# Patient Record
Sex: Male | Born: 1985
Health system: Southern US, Community
[De-identification: ages and names within clinical notes are randomized; demographics above are authoritative.]

## PROBLEM LIST (undated history)

## (undated) DIAGNOSIS — N200 Calculus of kidney: Secondary | ICD-10-CM

## (undated) HISTORY — PX: LACERATION REPAIR: SHX5168

---

## 2015-03-04 ENCOUNTER — Encounter (HOSPITAL_COMMUNITY): Payer: Self-pay | Admitting: *Deleted

## 2015-03-04 ENCOUNTER — Emergency Department (HOSPITAL_COMMUNITY)
Admission: EM | Admit: 2015-03-04 | Discharge: 2015-03-04 | Disposition: A | Discharge status: Home / Self Care | Payer: Self-pay | Attending: Emergency Medicine | Admitting: Emergency Medicine

## 2015-03-04 DIAGNOSIS — N200 Calculus of kidney: Secondary | ICD-10-CM | POA: Insufficient documentation

## 2015-03-04 HISTORY — DX: Calculus of kidney: N20.0

## 2015-03-04 LAB — CBC WITH DIFFERENTIAL/PLATELET
Basophils Absolute: 0 10*3/uL (ref 0.0–0.1)
Basophils Relative: 0 % (ref 0–1)
Eosinophils Absolute: 0.2 10*3/uL (ref 0.0–0.7)
Eosinophils Relative: 2 % (ref 0–5)
HCT: 44.3 % (ref 39.0–52.0)
HEMOGLOBIN: 15.1 g/dL (ref 13.0–17.0)
LYMPHS PCT: 37 % (ref 12–46)
Lymphs Abs: 3 10*3/uL (ref 0.7–4.0)
MCH: 28.7 pg (ref 26.0–34.0)
MCHC: 34.1 g/dL (ref 30.0–36.0)
MCV: 84.1 fL (ref 78.0–100.0)
Monocytes Absolute: 0.4 10*3/uL (ref 0.1–1.0)
Monocytes Relative: 5 % (ref 3–12)
NEUTROS ABS: 4.5 10*3/uL (ref 1.7–7.7)
NEUTROS PCT: 56 % (ref 43–77)
Platelets: 168 10*3/uL (ref 150–400)
RBC: 5.27 MIL/uL (ref 4.22–5.81)
RDW: 13.1 % (ref 11.5–15.5)
WBC: 8 10*3/uL (ref 4.0–10.5)

## 2015-03-04 LAB — COMPREHENSIVE METABOLIC PANEL
ALBUMIN: 4.1 g/dL (ref 3.5–5.2)
ALT: 36 U/L (ref 0–53)
AST: 24 U/L (ref 0–37)
Alkaline Phosphatase: 69 U/L (ref 39–117)
Anion gap: 10 (ref 5–15)
BILIRUBIN TOTAL: 1.2 mg/dL (ref 0.3–1.2)
BUN: 10 mg/dL (ref 6–23)
CALCIUM: 9.4 mg/dL (ref 8.4–10.5)
CO2: 27 mmol/L (ref 19–32)
CREATININE: 1.32 mg/dL (ref 0.50–1.35)
Chloride: 103 mmol/L (ref 96–112)
GFR calc Af Amer: 83 mL/min — ABNORMAL LOW (ref >90)
GFR, EST NON AFRICAN AMERICAN: 72 mL/min — AB (ref >90)
Glucose, Bld: 110 mg/dL — ABNORMAL HIGH (ref 70–99)
Potassium: 3.9 mmol/L (ref 3.5–5.1)
Sodium: 140 mmol/L (ref 135–145)
Total Protein: 6.6 g/dL (ref 6.0–8.3)

## 2015-03-04 LAB — URINALYSIS, ROUTINE W REFLEX MICROSCOPIC
Bilirubin Urine: NEGATIVE
GLUCOSE, UA: NEGATIVE mg/dL
KETONES UR: NEGATIVE mg/dL
LEUKOCYTES UA: NEGATIVE
NITRITE: NEGATIVE
Protein, ur: 30 mg/dL — AB
Specific Gravity, Urine: 1.028 (ref 1.005–1.030)
Urobilinogen, UA: 0.2 mg/dL (ref 0.0–1.0)
pH: 5 (ref 5.0–8.0)

## 2015-03-04 LAB — URINE MICROSCOPIC-ADD ON

## 2015-03-04 MED ORDER — ONDANSETRON HCL 4 MG PO TABS
8.0000 mg | ORAL_TABLET | Freq: Once | ORAL | Status: AC
  Administered 2015-03-04: 8 mg via ORAL

## 2015-03-04 MED ORDER — HYDROMORPHONE HCL 1 MG/ML IJ SOLN
INTRAMUSCULAR | Status: AC
  Filled 2015-03-04: qty 1

## 2015-03-04 MED ORDER — OXYCODONE-ACETAMINOPHEN 5-325 MG PO TABS: 1.0000 | ORAL_TABLET | Freq: Four times a day (QID) | ORAL | Status: DC | PRN

## 2015-03-04 MED ORDER — ONDANSETRON 4 MG PO TBDP
ORAL_TABLET | ORAL | Status: AC
  Administered 2015-03-04: 8 mg
  Filled 2015-03-04: qty 2

## 2015-03-04 MED ORDER — SODIUM CHLORIDE 0.9 % IV BOLUS (SEPSIS)
1000.0000 mL | INTRAVENOUS | Status: AC
  Administered 2015-03-04: 1000 mL via INTRAVENOUS

## 2015-03-04 MED ORDER — HYDROMORPHONE HCL 1 MG/ML IJ SOLN
1.0000 mg | Freq: Once | INTRAMUSCULAR | Status: AC
  Administered 2015-03-04: 1 mg via INTRAVENOUS

## 2015-03-04 MED ORDER — OXYCODONE-ACETAMINOPHEN 5-325 MG PO TABS
1.0000 | ORAL_TABLET | Freq: Once | ORAL | Status: AC
Start: 2015-03-04 — End: 2015-03-04
  Administered 2015-03-04: 1 via ORAL
  Filled 2015-03-04: qty 1

## 2015-03-04 NOTE — ED Provider Notes (Signed)
CSN: [id number]     Arrival date & time 03/04/15  0100 History   First MD Initiated Contact with Patient 03/04/15 0155     This chart was scribed for Purvis Sheffield, MD by Arlan Organ, ED Scribe. This patient was seen in room A13C/A13C and the patient's care was started 1:59 AM.   Chief Complaint  Patient presents with  . Flank Pain   Patient is a 29 y.o. male presenting with flank pain. The history is provided by the patient. No language interpreter was used.  Flank Pain This is a recurrent problem. The current episode started 3 to 5 hours ago. The problem occurs constantly. The problem has not changed since onset.Pertinent negatives include no chest pain, no abdominal pain, no headaches and no shortness of breath. Nothing aggravates the symptoms. Nothing relieves the symptoms. He has tried nothing for the symptoms.    HPI Comments: Thelmer Legler is a 29 y.o. male without any pertinent past medical history who presents to the Emergency Department complaining of constant, sudden onset, non-radiating R sided flank pain x 4 hours. Pain currently rated 8/10. He also reports 4 episodes of vomiting this evening. Pt has tried OTC Ibuprofen without any improvement for symptoms. Pt reports previous history of 3 kidney stones all passed naturally. No recent fever or chills. Pt with known allergies to Vicodin and Hydrocodone.  Past Medical History  Diagnosis Date  . Kidney stones    History reviewed. No pertinent past surgical history. No family history on file. History  Substance Use Topics  . Smoking status: Never Smoker   . Smokeless tobacco: Never Used  . Alcohol Use: No    Review of Systems  Constitutional: Negative for fever and chills.  HENT: Negative for rhinorrhea and sore throat.   Eyes: Negative for visual disturbance.  Respiratory: Negative for cough and shortness of breath.   Cardiovascular: Negative for chest pain and leg swelling.  Gastrointestinal: Positive for nausea and  vomiting. Negative for abdominal pain and diarrhea.  Genitourinary: Positive for flank pain. Negative for dysuria.  Musculoskeletal: Negative for back pain and neck pain.  Skin: Negative for rash.  Neurological: Negative for dizziness, light-headedness and headaches.  Hematological: Does not bruise/bleed easily.  Psychiatric/Behavioral: Negative for confusion.      Allergies  Hydrocodone  Home Medications   Prior to Admission medications   Not on File   Triage Vitals: BP 145/91 mmHg  Pulse 73  Temp(Src) 97.7 F (36.5 C) (Oral)  Resp 18  Ht  (1.905 m)  Wt 246 lb (111.585 kg)  BMI 30.75 kg/m2  SpO2 99%   Physical Exam  Constitutional: He is oriented to person, place, and time. He appears well-developed and well-nourished. No distress.  HENT:  Head: Normocephalic and atraumatic.  Right Ear: Hearing normal.  Left Ear: Hearing normal.  Nose: Nose normal.  Mouth/Throat: Oropharynx is clear and moist and mucous membranes are normal.  Eyes: Conjunctivae and EOM are normal. Pupils are equal, round, and reactive to light.  Neck: Normal range of motion. Neck supple.  Cardiovascular: Regular rhythm, S1 normal and S2 normal.  Exam reveals no gallop and no friction rub.   No murmur heard. Pulmonary/Chest: Effort normal and breath sounds normal. No respiratory distress. He exhibits no tenderness.  Abdominal: Soft. Normal appearance and bowel sounds are normal. There is no hepatosplenomegaly. There is no tenderness. There is no rebound, no guarding, no tenderness at McBurney's point and negative Murphy's sign. No hernia.  No focal tenderness  Musculoskeletal: Normal range of motion.  Neurological: He is alert and oriented to person, place, and time. He has normal strength. No cranial nerve deficit or sensory deficit. Coordination normal. GCS eye subscore is 4. GCS verbal subscore is 5. GCS motor subscore is 6.  Skin: Skin is warm, dry and intact. No rash noted. No cyanosis.   Psychiatric: He has a normal mood and affect. His speech is normal and behavior is normal. Thought content normal.  Nursing note and vitals reviewed.   ED Course  Procedures (including critical care time)  DIAGNOSTIC STUDIES: Oxygen Saturation is 99% on RA, Normal by my interpretation.    COORDINATION OF CARE: 2:11 AM-Discussed treatment plan with pt at bedside and pt agreed to plan.     Labs Review Labs Reviewed  URINALYSIS, ROUTINE W REFLEX MICROSCOPIC - Abnormal; Notable for the following:    APPearance TURBID (*)    Hgb urine dipstick LARGE (*)    Protein, ur 30 (*)    All other components within normal limits  URINE MICROSCOPIC-ADD ON - Abnormal; Notable for the following:    Bacteria, UA MANY (*)    Crystals CA OXALATE CRYSTALS (*)    All other components within normal limits  URINE CULTURE  CBC WITH DIFFERENTIAL/PLATELET  COMPREHENSIVE METABOLIC PANEL    Imaging Review No results found.   EKG Interpretation None      MDM   Final diagnoses:  Kidney stone    6:47 AM 29 y.o. male here with right-sided flank pain consistent with previous kidney stones. No focal tenderness of his abdomen on exam. Afebrile and vital signs are unremarkable here. Do not think any imaging is needed as his symptoms are consistent with previous stones. He does have some bacteria in his urine, we'll send urine culture. Will provide resources to follow-up with urology. Return for any worsening including vomiting, worsening pain, or fever.   I personally performed the services described in this documentation, which was scribed in my presence. The recorded information has been reviewed and is accurate.    Purvis SheffieldForrest Hilaria Titsworth, MD 03/04/15 [phone]

## 2015-03-04 NOTE — ED Notes (Signed)
Pt c/o R flank pain onset tonight associated with NV. Hx of kidney stones; sts pain feels similar

## 2015-03-04 NOTE — Discharge Instructions (Signed)

## 2015-03-04 NOTE — ED Notes (Signed)
C/o rt flank pain with vomiting. Pt has hx of kidney stones and states it feels similar. Vomited x3 times.

## 2015-03-05 LAB — URINE CULTURE
Colony Count: NO GROWTH
Culture: NO GROWTH

## 2020-10-28 ENCOUNTER — Other Ambulatory Visit: Payer: Self-pay

## 2020-10-28 ENCOUNTER — Emergency Department (HOSPITAL_BASED_OUTPATIENT_CLINIC_OR_DEPARTMENT_OTHER): Payer: Self-pay

## 2020-10-28 ENCOUNTER — Encounter (HOSPITAL_BASED_OUTPATIENT_CLINIC_OR_DEPARTMENT_OTHER): Payer: Self-pay | Admitting: *Deleted

## 2020-10-28 ENCOUNTER — Emergency Department (HOSPITAL_BASED_OUTPATIENT_CLINIC_OR_DEPARTMENT_OTHER)
Admission: EM | Admit: 2020-10-28 | Discharge: 2020-10-28 | Disposition: A | Discharge status: Home / Self Care | Payer: Self-pay | Attending: Emergency Medicine | Admitting: Emergency Medicine

## 2020-10-28 DIAGNOSIS — Z87442 Personal history of urinary calculi: Secondary | ICD-10-CM | POA: Insufficient documentation

## 2020-10-28 DIAGNOSIS — N132 Hydronephrosis with renal and ureteral calculous obstruction: Secondary | ICD-10-CM | POA: Insufficient documentation

## 2020-10-28 LAB — BASIC METABOLIC PANEL
Anion gap: 10 (ref 5–15)
BUN: 18 mg/dL (ref 6–20)
CO2: 24 mmol/L (ref 22–32)
Calcium: 9.4 mg/dL (ref 8.9–10.3)
Chloride: 102 mmol/L (ref 98–111)
Creatinine, Ser: 1.57 mg/dL — ABNORMAL HIGH (ref 0.61–1.24)
GFR, Estimated: 59 mL/min — ABNORMAL LOW (ref >60)
Glucose, Bld: 131 mg/dL — ABNORMAL HIGH (ref 70–99)
Potassium: 4.2 mmol/L (ref 3.5–5.1)
Sodium: 136 mmol/L (ref 135–145)

## 2020-10-28 LAB — CBC WITH DIFFERENTIAL/PLATELET
Abs Immature Granulocytes: 0.05 10*3/uL (ref 0.00–0.07)
Basophils Absolute: 0.1 10*3/uL (ref 0.0–0.1)
Basophils Relative: 0 %
Eosinophils Absolute: 0 10*3/uL (ref 0.0–0.5)
Eosinophils Relative: 0 %
HCT: 49.7 % (ref 39.0–52.0)
Hemoglobin: 16.8 g/dL (ref 13.0–17.0)
Immature Granulocytes: 0 %
Lymphocytes Relative: 11 %
Lymphs Abs: 1.6 10*3/uL (ref 0.7–4.0)
MCH: 28.9 pg (ref 26.0–34.0)
MCHC: 33.8 g/dL (ref 30.0–36.0)
MCV: 85.5 fL (ref 80.0–100.0)
Monocytes Absolute: 0.9 10*3/uL (ref 0.1–1.0)
Monocytes Relative: 6 %
Neutro Abs: 11.8 10*3/uL — ABNORMAL HIGH (ref 1.7–7.7)
Neutrophils Relative %: 83 %
Platelets: 224 10*3/uL (ref 150–400)
RBC: 5.81 MIL/uL (ref 4.22–5.81)
RDW: 12.6 % (ref 11.5–15.5)
WBC: 14.3 10*3/uL — ABNORMAL HIGH (ref 4.0–10.5)
nRBC: 0 % (ref 0.0–0.2)

## 2020-10-28 LAB — URINALYSIS, ROUTINE W REFLEX MICROSCOPIC
Bilirubin Urine: NEGATIVE
Glucose, UA: NEGATIVE mg/dL
Ketones, ur: NEGATIVE mg/dL
Leukocytes,Ua: NEGATIVE
Nitrite: NEGATIVE
Protein, ur: 30 mg/dL — AB
Specific Gravity, Urine: >1.03 — ABNORMAL HIGH (ref 1.005–1.030)
pH: 6.5 (ref 5.0–8.0)

## 2020-10-28 LAB — URINALYSIS, MICROSCOPIC (REFLEX)

## 2020-10-28 LAB — LIPASE, BLOOD: Lipase: 39 U/L (ref 11–51)

## 2020-10-28 MED ORDER — ONDANSETRON 4 MG PO TBDP: 4.0000 mg | ORAL_TABLET | Freq: Three times a day (TID) | ORAL | 0 refills | Status: DC | PRN

## 2020-10-28 MED ORDER — HYDROMORPHONE HCL 1 MG/ML IJ SOLN
0.5000 mg | Freq: Once | INTRAMUSCULAR | Status: AC
  Administered 2020-10-28: 0.5 mg via INTRAVENOUS
  Filled 2020-10-28: qty 1

## 2020-10-28 MED ORDER — SODIUM CHLORIDE 0.9 % IV BOLUS
1000.0000 mL | Freq: Once | INTRAVENOUS | Status: AC
  Administered 2020-10-28: 1000 mL via INTRAVENOUS

## 2020-10-28 MED ORDER — KETOROLAC TROMETHAMINE 30 MG/ML IJ SOLN
30.0000 mg | Freq: Once | INTRAMUSCULAR | Status: AC
  Administered 2020-10-28: 30 mg via INTRAVENOUS
  Filled 2020-10-28: qty 1

## 2020-10-28 MED ORDER — OXYCODONE-ACETAMINOPHEN 5-325 MG PO TABS: 1.0000 | ORAL_TABLET | Freq: Four times a day (QID) | ORAL | 0 refills | Status: DC | PRN

## 2020-10-28 MED ORDER — TAMSULOSIN HCL 0.4 MG PO CAPS: 0.4000 mg | ORAL_CAPSULE | Freq: Every day | ORAL | 0 refills | Status: AC

## 2020-10-28 MED ORDER — ONDANSETRON HCL 4 MG/2ML IJ SOLN
4.0000 mg | Freq: Once | INTRAMUSCULAR | Status: AC
  Administered 2020-10-28: 4 mg via INTRAVENOUS
  Filled 2020-10-28: qty 2

## 2020-10-28 MED ORDER — HYDROMORPHONE HCL 1 MG/ML IJ SOLN
1.0000 mg | Freq: Once | INTRAMUSCULAR | Status: AC
  Administered 2020-10-28: 1 mg via INTRAVENOUS
  Filled 2020-10-28: qty 1

## 2020-10-28 MED ORDER — IOHEXOL 300 MG/ML  SOLN
100.0000 mL | Freq: Once | INTRAMUSCULAR | Status: AC | PRN
  Administered 2020-10-28: 100 mL via INTRAVENOUS

## 2020-10-28 NOTE — ED Provider Notes (Signed)
[facility] EMERGENCY DEPARTMENT Provider Note   CSN: [id number] Arrival date & time: 10/28/20  1106     History Chief Complaint  Patient presents with  . Abdominal Pain  . Emesis    Austin Heath is a 34 y.o. male who presents to the ED today with complaint of sudden onset, constant, sharp, left flank pain radiating into LLQ that woke pt out of his sleep at 1 AM this morning. Pt also complains of nausea and NBNB emesis x 3. He reports history of kidney stones in the past however states this feels different. Last kidney stone was in 2016. He has had to have surgery twice for removal of stones in the past. Pt does mention he has not urinated all morning which is atypical for him. Last normal bowel movement last night. Denies any recent sick contacts or suspicious food intake. Wife is at bedside without any symptoms. Pt denies fevers, chills, dysuria, hematuria, diarrhea, constipation, testicular pain, or any other associated symptoms.   The history is provided by the patient and medical records.       Past Medical History:  Diagnosis Date  . Kidney stones     There are no problems to display for this patient.   Past Surgical History:  Procedure Laterality Date  . LACERATION REPAIR         No family history on file.  Social History   Tobacco Use  . Smoking status: Never Smoker  . Smokeless tobacco: Never Used  Substance Use Topics  . Alcohol use: No  . Drug use: No    Home Medications Prior to Admission medications   Medication Sig Start Date End Date Taking? Authorizing Provider  ondansetron (ZOFRAN ODT) 4 MG disintegrating tablet Take 1 tablet (4 mg total) by mouth every 8 (eight) hours as needed for nausea or vomiting. 10/28/20   Tanda RockersVenter, Jahson Emanuele, PA-C  oxyCODONE-acetaminophen (PERCOCET/ROXICET) 5-325 MG tablet Take 1 tablet by mouth every 6 (six) hours as needed for severe pain. 10/28/20   Tanda RockersVenter, Jene Huq, PA-C  tamsulosin (FLOMAX) 0.4 MG CAPS  capsule Take 1 capsule (0.4 mg total) by mouth daily for 7 days. 10/28/20 11/04/20  Tanda RockersVenter, Fidelis Loth, PA-C    Allergies    Hydrocodone  Review of Systems   Review of Systems  Constitutional: Negative for chills and fever.  Gastrointestinal: Positive for abdominal pain, nausea and vomiting. Negative for constipation and diarrhea.  Genitourinary: Positive for difficulty urinating and flank pain. Negative for dysuria, enuresis, frequency, scrotal swelling and testicular pain.  All other systems reviewed and are negative.   Physical Exam Updated Vital Signs BP (!) 143/89   Pulse 84   Temp 98.1 F (36.7 C) (Oral)   Resp 18   Ht 6\' 3"  (1.905 m)   Wt 116.7 kg   SpO2 100%   BMI 32.16 kg/m   Physical Exam Vitals and nursing note reviewed.  Constitutional:      Appearance: He is not ill-appearing or diaphoretic.  HENT:     Head: Normocephalic and atraumatic.  Eyes:     Conjunctiva/sclera: Conjunctivae normal.  Cardiovascular:     Rate and Rhythm: Normal rate and regular rhythm.     Heart sounds: Normal heart sounds.  Pulmonary:     Effort: Pulmonary effort is normal.     Breath sounds: Normal breath sounds. No wheezing, rhonchi or rales.  Abdominal:     Palpations: Abdomen is soft.     Tenderness: There is generalized abdominal tenderness and tenderness  in the left lower quadrant. There is left CVA tenderness. There is no guarding or rebound.     Comments: Soft, diffuse abdominal TTP however worse in the LLQ, +BS throughout, no r/g/r, neg murphy's, neg mcburney's, + left CVA TTP  Musculoskeletal:     Cervical back: Neck supple.  Skin:    General: Skin is warm and dry.  Neurological:     Mental Status: He is alert.     ED Results / Procedures / Treatments   Labs (all labs ordered are listed, but only abnormal results are displayed) Labs Reviewed  URINALYSIS, ROUTINE W REFLEX MICROSCOPIC - Abnormal; Notable for the following components:      Result Value   Specific  Gravity, Urine >1.030 (*)    Hgb urine dipstick MODERATE (*)    Protein, ur 30 (*)    All other components within normal limits  URINALYSIS, MICROSCOPIC (REFLEX) - Abnormal; Notable for the following components:   Bacteria, UA FEW (*)    All other components within normal limits  BASIC METABOLIC PANEL - Abnormal; Notable for the following components:   Glucose, Bld 131 (*)    Creatinine, Ser 1.57 (*)    GFR, Estimated 59 (*)    All other components within normal limits  CBC WITH DIFFERENTIAL/PLATELET - Abnormal; Notable for the following components:   WBC 14.3 (*)    Neutro Abs 11.8 (*)    All other components within normal limits  LIPASE, BLOOD    EKG None  Radiology CT Abdomen Pelvis W Contrast  Result Date: 10/28/2020 CLINICAL DATA:  Left-sided flank and back pain with nausea and vomiting. EXAM: CT ABDOMEN AND PELVIS WITH CONTRAST TECHNIQUE: Multidetector CT imaging of the abdomen and pelvis was performed using the standard protocol following bolus administration of intravenous contrast. CONTRAST:  OMNIPAQUE IOHEXOL 300 MG/ML  SOLN COMPARISON:  10/15/2015 FINDINGS: Lower chest: The lung bases are clear of acute process. No pleural effusion or pulmonary lesions. The heart is normal in size. No pericardial effusion. The distal esophagus and aorta are unremarkable. Hepatobiliary: Diffuse fatty infiltration of the liver but no focal hepatic lesions or intrahepatic biliary dilatation. The gallbladder is normal. No common bile duct dilatation. The portal and hepatic veins are patent. Pancreas: No mass, inflammation or ductal dilatation. Spleen: Normal size.  No focal lesions. Adrenals/Urinary Tract: The adrenal glands are normal. The right kidney is normal.  The bladder is normal. Decreased perfusion of the right kidney and moderate hydroureteronephrosis down to an obstructing 6 x 5 mm calculus in the upper left ureter located at the L3 vertebral body level. On the coronal images this  may actually be 2 adjacent smaller stones. No more distal ureteral calculi. No bladder calculi. Stomach/Bowel: The stomach, duodenum, small bowel and colon are grossly normal without oral contrast. No inflammatory changes, mass lesions or obstructive findings. The appendix is normal. Vascular/Lymphatic: The aorta is normal in caliber. No dissection. The branch vessels are patent. The major venous structures are patent. No mesenteric or retroperitoneal mass or adenopathy. Small scattered lymph nodes are noted. Reproductive: The prostate gland and seminal vesicles are unremarkable. Bilateral vasectomy clips are noted. Other: No pelvic mass or adenopathy. No free pelvic fluid collections. No inguinal mass or adenopathy. No abdominal wall hernia or subcutaneous lesions. Musculoskeletal: No significant bony findings. IMPRESSION: 1. 6 x 5 mm upper left ureteral calculus causing moderate to high-grade obstruction. I believe this might actually be 2 adjacent calculi with the upper calculus measuring a maximum  of 4 mm and the lower calculus measuring a maximum of 3 mm. 2. No other significant abdominal/pelvic findings, mass lesions or adenopathy. 3. Diffuse fatty infiltration of the liver. Electronically Signed   By: Rudie Meyer M.D.   On: 10/28/2020 12:47    Procedures Procedures (including critical care time)  Medications Ordered in ED Medications  sodium chloride 0.9 % bolus 1,000 mL (0 mLs Intravenous Stopped 10/28/20 1320)  ondansetron (ZOFRAN) injection 4 mg (4 mg Intravenous Given 10/28/20 1158)  ketorolac (TORADOL) 30 MG/ML injection 30 mg (30 mg Intravenous Given 10/28/20 1158)  iohexol (OMNIPAQUE) 300 MG/ML solution 100 mL (100 mLs Intravenous Contrast Given 10/28/20 1215)  HYDROmorphone (DILAUDID) injection 1 mg (1 mg Intravenous Given 10/28/20 1258)  HYDROmorphone (DILAUDID) injection 0.5 mg (0.5 mg Intravenous Given 10/28/20 1451)    ED Course  I have reviewed the triage vital signs and the  nursing notes.  Pertinent labs & imaging results that were available during my care of the patient were reviewed by me and considered in my medical decision making (see chart for details).  Clinical Course as of Oct 28 1525  Thu Oct 28, 2020  1324 Discussed case with Dr. Ronne Binning with urology who recommends outpatient follow up as long as pain is controlled.    [MV]    Clinical Course User Index [MV] Tanda Rockers, PA-C   MDM Rules/Calculators/A&P                          34 year old male presenting to the ED today with complaint of sudden onset, constant, left flank pain that began around 1 AM with associated N/V. History of stones however states this feels different. On arrival VSS. Pt is afebrile, nontachycardic, and nontachypneic. On exam pt has diffuse abdominal TTP however worse in the left flank/LLQ. Suspect diffuse pain s/2 multiple episodes of emesis. Will plan for U/A at this time and if any signs of hgb will proceed with concern for kidney stones given history. I have lower suspicion for acute abdomen at this time. Pain meds provided.   U/A with moderate hgb and 11-20 RBCs. Per HPF. CBC and BMP ordered with CT renal stone study.   CBC has returned with a leukocytosis of 14.3. In the setting of diffuse pain and leukocytosis have changed CT scan to one with contrast to assess for other etiologies as well at stones. Lipase added.   BMP with creatinine 1.57. Pt receiving fluids.   Lab Results  Component Value Date   CREATININE 1.57 (H) 10/28/2020   CREATININE 1.32 03/04/2015   Lipase 39  CT scan with findings of 6x5 mm stone with moderate to high grade obstruction. Pt did report some issues with urinating however was able to give [place] a urine sample without difficulty and a post void residual was 0 CCs. Given large stone and high grade obstruction will consult urology at this time  Discussed case with urology - see above in ED course. Will treat pain and reevaluate.   Pt  reevaluated after total 1.5 mg dilaudid and toradol. Reports pain is improved. Has been able to tolerate gingerale without difficulty. Will discharge home at this time with urology follow up with zofran, pain meds, and flomax. Pt is in agreement with plan and stable for discharge home.   This note was prepared using Dragon voice recognition software and may include unintentional dictation errors due to the inherent limitations of voice recognition software.  Final  Clinical Impression(s) / ED Diagnoses Final diagnoses:  Hydronephrosis with renal and ureteral calculus obstruction    Rx / DC Orders ED Discharge Orders         Ordered    tamsulosin (FLOMAX) 0.4 MG CAPS capsule  Daily        10/28/20 1523    ondansetron (ZOFRAN ODT) 4 MG disintegrating tablet  Every 8 hours PRN        10/28/20 1523    oxyCODONE-acetaminophen (PERCOCET/ROXICET) 5-325 MG tablet  Every 6 hours PRN        10/28/20 1523           Discharge Instructions     Please follow up with [facility] regarding ED visit today I have prescribed medication for you to take including flomax to help you urinate and push out the stone. Increase your fluid intake to help as well.  Take pain medication and nausea medication as needed  Return to the ED IMMEDIATELY for any worsening symptoms including worsening pain, fevers > 100.4, inability to urinate, or any other new/concerning symptoms       Tanda Rockers, PA-C 10/28/20 1526    Tegeler, Canary Brim, MD 10/28/20 [phone]

## 2020-10-28 NOTE — ED Triage Notes (Signed)
Left flank pain woke him at 1am and vomiting this am. Hx of stones and this pain does not feel like a stone per pt. He is ambulatory.

## 2020-10-28 NOTE — Discharge Instructions (Signed)
Please follow up with [facility] regarding ED visit today I have prescribed medication for you to take including flomax to help you urinate and push out the stone. Increase your fluid intake to help as well.  Take pain medication and nausea medication as needed  Return to the ED IMMEDIATELY for any worsening symptoms including worsening pain, fevers > 100.4, inability to urinate, or any other new/concerning symptoms

## 2020-10-28 NOTE — ED Notes (Signed)
Given ginger ale 

## 2020-10-28 NOTE — ED Notes (Signed)
Bladder scan reports 0 in bladder

## 2020-11-02 ENCOUNTER — Emergency Department (HOSPITAL_BASED_OUTPATIENT_CLINIC_OR_DEPARTMENT_OTHER)
Admission: EM | Admit: 2020-11-02 | Discharge: 2020-11-02 | Disposition: A | Discharge status: Home / Self Care | Payer: Self-pay | Attending: Emergency Medicine | Admitting: Emergency Medicine

## 2020-11-02 ENCOUNTER — Encounter (HOSPITAL_BASED_OUTPATIENT_CLINIC_OR_DEPARTMENT_OTHER): Payer: Self-pay | Admitting: Emergency Medicine

## 2020-11-02 ENCOUNTER — Other Ambulatory Visit: Payer: Self-pay

## 2020-11-02 DIAGNOSIS — N201 Calculus of ureter: Secondary | ICD-10-CM | POA: Insufficient documentation

## 2020-11-02 LAB — CBC
HCT: 45.5 % (ref 39.0–52.0)
Hemoglobin: 15.6 g/dL (ref 13.0–17.0)
MCH: 29 pg (ref 26.0–34.0)
MCHC: 34.3 g/dL (ref 30.0–36.0)
MCV: 84.6 fL (ref 80.0–100.0)
Platelets: 207 10*3/uL (ref 150–400)
RBC: 5.38 MIL/uL (ref 4.22–5.81)
RDW: 12.6 % (ref 11.5–15.5)
WBC: 10.3 10*3/uL (ref 4.0–10.5)
nRBC: 0 % (ref 0.0–0.2)

## 2020-11-02 LAB — URINALYSIS, ROUTINE W REFLEX MICROSCOPIC
Bilirubin Urine: NEGATIVE
Glucose, UA: NEGATIVE mg/dL
Ketones, ur: NEGATIVE mg/dL
Leukocytes,Ua: NEGATIVE
Nitrite: NEGATIVE
Protein, ur: NEGATIVE mg/dL
Specific Gravity, Urine: 1.02 (ref 1.005–1.030)
pH: 7 (ref 5.0–8.0)

## 2020-11-02 LAB — BASIC METABOLIC PANEL
Anion gap: 10 (ref 5–15)
BUN: 18 mg/dL (ref 6–20)
CO2: 23 mmol/L (ref 22–32)
Calcium: 9.4 mg/dL (ref 8.9–10.3)
Chloride: 103 mmol/L (ref 98–111)
Creatinine, Ser: 1.8 mg/dL — ABNORMAL HIGH (ref 0.61–1.24)
GFR, Estimated: 50 mL/min — ABNORMAL LOW (ref >60)
Glucose, Bld: 113 mg/dL — ABNORMAL HIGH (ref 70–99)
Potassium: 4.5 mmol/L (ref 3.5–5.1)
Sodium: 136 mmol/L (ref 135–145)

## 2020-11-02 LAB — URINALYSIS, MICROSCOPIC (REFLEX)

## 2020-11-02 MED ORDER — OXYCODONE-ACETAMINOPHEN 5-325 MG PO TABS: 1.0000 | ORAL_TABLET | Freq: Once | ORAL | Status: DC

## 2020-11-02 MED ORDER — SODIUM CHLORIDE 0.9 % IV BOLUS
1000.0000 mL | Freq: Once | INTRAVENOUS | Status: AC
  Administered 2020-11-02: 1000 mL via INTRAVENOUS

## 2020-11-02 MED ORDER — KETOROLAC TROMETHAMINE 15 MG/ML IJ SOLN
15.0000 mg | Freq: Once | INTRAMUSCULAR | Status: AC
  Administered 2020-11-02: 15 mg via INTRAVENOUS
  Filled 2020-11-02: qty 1

## 2020-11-02 MED ORDER — MORPHINE SULFATE (PF) 4 MG/ML IV SOLN
4.0000 mg | Freq: Once | INTRAVENOUS | Status: AC
  Administered 2020-11-02: 4 mg via INTRAVENOUS
  Filled 2020-11-02: qty 1

## 2020-11-02 MED ORDER — ONDANSETRON HCL 4 MG/2ML IJ SOLN
4.0000 mg | Freq: Once | INTRAMUSCULAR | Status: AC
  Administered 2020-11-02: 4 mg via INTRAVENOUS
  Filled 2020-11-02: qty 2

## 2020-11-02 MED ORDER — ONDANSETRON 4 MG PO TBDP: 4.0000 mg | ORAL_TABLET | Freq: Once | ORAL | Status: DC

## 2020-11-02 MED ORDER — KETOROLAC TROMETHAMINE 30 MG/ML IJ SOLN
30.0000 mg | Freq: Once | INTRAMUSCULAR | Status: AC
  Administered 2020-11-02: 30 mg via INTRAVENOUS
  Filled 2020-11-02: qty 1

## 2020-11-02 MED ORDER — OXYCODONE-ACETAMINOPHEN 5-325 MG PO TABS
1.0000 | ORAL_TABLET | ORAL | 0 refills | Status: DC | PRN
Start: 2020-11-02 — End: 2023-02-14

## 2020-11-02 NOTE — Discharge Instructions (Addendum)
You were evaluated in the emergency department today for your persistent pain secondary to your kidney stone on the left side.  While in the emergency department we were better able to control your pain.  I discussed your case with the on-call urologist Dr. Hart Robinsons, who felt it was reasonable to be discharged home this evening with close follow-up in the office tomorrow.  Below is the contact information for [facility].  Please call their office first in tomorrow morning and inform them of our discussion, as well as the input of Dr. Hart Robinsons that you should be seen as soon as possible.  You may continue take pain medications prescribed to your last visit for pain at home.  Please return to the emergency department if your pain becomes intolerable, you are no longer making urine, you develop fever, chills, nausea or vomiting that does not stop, or other new severe symptoms.

## 2020-11-02 NOTE — ED Provider Notes (Signed)
[facility] EMERGENCY DEPARTMENT Provider Note   CSN: [id number] Arrival date & time: 11/02/20  1513     History Chief Complaint  Patient presents with  . Flank Pain    Austin Heath is a 34 y.o. male who presents with persistent sharp left lower quadrant pain, nausea, vomiting since diagnosis of ureteral stone on 10/28/2020.   He states he was discharged with Zofran, Norco, Flomax.  He states that the medications helped for the first 3 days, however the last 48 hours medications are no longer helping.  He states he has been having NBNB emesis several episodes of the last 2 days, he is unable to tolerate anything p.o.   He states that he is making significantly less urine in the last 24 hours than he was prior to this. He states he is only able to urinate twice today. He states that his pain feels like it is in the exact same location as it was prior, however now he also endorses some discomfort extending up into his left back.  Denies fevers, chills, hematuria, diarrhea, constipation, testicular pain, scrotal pain.  History provided by the patient. I personally reviewed this patient's medical records.  He has history of kidney stones in the past, to which have required intervention for removal.  CT abdomen pelvis with contrast on 11/25 revealed 6 x 5 mm upper left ureteral calculus causing moderate to high-grade obstruction.  Radiologist only be 2 adjacent calculi in the upper calculus measuring maximally 4 mm, the lower 3 mm.  Urology was consulted at that time with preference to follow up outpatient, assuming pain management could be attained at home.   HPI     Past Medical History:  Diagnosis Date  . Kidney stones     There are no problems to display for this patient.   Past Surgical History:  Procedure Laterality Date  . LACERATION REPAIR         History reviewed. No pertinent family history.  Social History   Tobacco Use  . Smoking status: Never  Smoker  . Smokeless tobacco: Never Used  Substance Use Topics  . Alcohol use: No  . Drug use: No    Home Medications Prior to Admission medications   Medication Sig Start Date End Date Taking? Authorizing Provider  ondansetron (ZOFRAN ODT) 4 MG disintegrating tablet Take 1 tablet (4 mg total) by mouth every 8 (eight) hours as needed for nausea or vomiting. 10/28/20   Tanda Rockers, PA-C  oxyCODONE-acetaminophen (PERCOCET/ROXICET) 5-325 MG tablet Take 1 tablet by mouth every 4 (four) hours as needed for severe pain. 11/02/20   Stafford Riviera, Eugene Gavia, PA-C  tamsulosin (FLOMAX) 0.4 MG CAPS capsule Take 1 capsule (0.4 mg total) by mouth daily for 7 days. 10/28/20 11/04/20  Tanda Rockers, PA-C    Allergies    Hydrocodone and Penicillins  Review of Systems   Review of Systems  Constitutional: Positive for appetite change. Negative for activity change, chills, diaphoresis, fatigue and fever.  HENT: Negative.   Eyes: Negative.   Respiratory: Negative for cough, chest tightness, shortness of breath and wheezing.   Cardiovascular: Negative for chest pain, palpitations and leg swelling.  Gastrointestinal: Positive for abdominal pain, nausea and vomiting. Negative for blood in stool, constipation and diarrhea.  Endocrine: Negative.   Genitourinary: Positive for decreased urine volume, difficulty urinating, dysuria and flank pain. Negative for hematuria, penile pain, penile swelling, scrotal swelling and testicular pain.  Musculoskeletal: Positive for back pain.  Skin: Negative.  Allergic/Immunologic: Negative.   Neurological: Negative.   Hematological: Negative.   Psychiatric/Behavioral: Negative.     Physical Exam Updated Vital Signs BP (!) 141/85   Pulse 68   Temp 99.1 F (37.3 C) (Oral)   Resp 16   Ht 6\' 3"  (1.905 m)   Wt 116.6 kg   SpO2 98%   BMI 32.12 kg/m   Physical Exam Vitals and nursing note reviewed.  HENT:     Head: Normocephalic and atraumatic.     Nose: Nose  normal.     Mouth/Throat:     Mouth: Mucous membranes are moist.     Pharynx: Oropharynx is clear. Uvula midline. No oropharyngeal exudate or posterior oropharyngeal erythema.  Eyes:     General:        Right eye: No discharge.        Left eye: No discharge.     Conjunctiva/sclera: Conjunctivae normal.     Pupils: Pupils are equal, round, and reactive to light.  Neck:     Trachea: Trachea and phonation normal.  Cardiovascular:     Rate and Rhythm: Normal rate and regular rhythm.     Pulses: Normal pulses.          Radial pulses are 2+ on the right side and 2+ on the left side.       Dorsalis pedis pulses are 2+ on the right side and 2+ on the left side.     Heart sounds: Normal heart sounds. No murmur heard.   Pulmonary:     Effort: Pulmonary effort is normal. No respiratory distress.     Breath sounds: Normal breath sounds. No wheezing or rales.  Chest:     Chest wall: No swelling, tenderness, crepitus or edema.  Abdominal:     General: Bowel sounds are normal. There is no distension.     Palpations: Abdomen is soft.     Tenderness: There is abdominal tenderness in the left upper quadrant and left lower quadrant. There is left CVA tenderness. There is no right CVA tenderness, guarding or rebound.  Musculoskeletal:        General: No deformity.     Cervical back: Neck supple. No rigidity or tenderness.     Right lower leg: No edema.     Left lower leg: No edema.  Lymphadenopathy:     Cervical: No cervical adenopathy.  Skin:    General: Skin is warm and dry.     Capillary Refill: Capillary refill takes less than 2 seconds.  Neurological:     General: No focal deficit present.     Mental Status: He is alert and oriented to person, place, and time. Mental status is at baseline.  Psychiatric:        Mood and Affect: Mood normal.     ED Results / Procedures / Treatments   Labs (all labs ordered are listed, but only abnormal results are displayed) Labs Reviewed    URINALYSIS, ROUTINE W REFLEX MICROSCOPIC - Abnormal; Notable for the following components:      Result Value   Hgb urine dipstick TRACE (*)    All other components within normal limits  BASIC METABOLIC PANEL - Abnormal; Notable for the following components:   Glucose, Bld 113 (*)    Creatinine, Ser 1.80 (*)    GFR, Estimated 50 (*)    All other components within normal limits  URINALYSIS, MICROSCOPIC (REFLEX) - Abnormal; Notable for the following components:   Bacteria, UA FEW (*)  All other components within normal limits  URINE CULTURE  CBC    EKG None  Radiology No results found.  Procedures Procedures (including critical care time)  Medications Ordered in ED Medications  sodium chloride 0.9 % bolus 1,000 mL (0 mLs Intravenous Stopped 11/02/20 1948)  morphine 4 MG/ML injection 4 mg (4 mg Intravenous Given 11/02/20 1841)  ondansetron (ZOFRAN) injection 4 mg (4 mg Intravenous Given 11/02/20 1838)  ketorolac (TORADOL) 30 MG/ML injection 30 mg (30 mg Intravenous Given 11/02/20 1945)  ketorolac (TORADOL) 15 MG/ML injection 15 mg (15 mg Intravenous Given 11/02/20 2044)    ED Course  I have reviewed the triage vital signs and the nursing notes.  Pertinent labs & imaging results that were available during my care of the patient were reviewed by me and considered in my medical decision making (see chart for details).    MDM Rules/Calculators/A&P                         34 year old male with history of kidney stones in the past who presents with persistent worsening pain secondary to kidney stone diagnosed on 10/28/2020.  Pain medications are longer helping at home, patient is vomiting, making less urine than prior.   Vital signs normal on intake.  Patient is afebrile and not tachycardic.  Physical exam significant for left lower quadrant tenderness to palpation, mild left CVA tenderness.  Labs obtained in triage.  CBC unremarkable, without leukocytosis.  BMP with mild  increase in creatinine to 1.8, previously 1.57.  UA with trace hemoglobin, few bacteria.  Pain medication, antiemetics, fluid bolus ordered. Will reevaluate the patient's pain after these interventions.  If necessary will reconsult urology for recommendations and potential admission for intervention.  Case discussed with urology resident Dr. Armstead Peaks who does Not feel this is an emergent case requiring transfer this patient to [facility] for admission and immediate intervention.  She states that if the patient's pain is poorly controlled this can be an option, or he may be discharged home this evening and she will place a request for close follow-up in the office tomorrow for scheduling of definitive treatment with stone removal.  At the time of my reevaluation the patient states his pain is much improved after dose of Toradol, now 4/10. Shared decision making with the patient regarding disposition plan: patient preference is to be discharged home this evening with close outpatient urology follow-up this week. I feel this is a reasonable plan.  Additional analgesia offered prior to discharge.   Urology resident Dr. Hart Robinsons notified of patient decision for disposition via secure message.   Shandon voiced understanding with medical evaluation and treatment plan.  Each of his questions were answered to his expressed satisfaction. Given reassuring laboratory studies, vital signs, and improved patient pain profile no further work-up is necessary in the emergency department at this time.  Return precautions were given.  Patient is stable and appropriate for discharge at this time.  Final Clinical Impression(s) / ED Diagnoses Final diagnoses:  Ureterolithiasis    Rx / DC Orders ED Discharge Orders         Ordered    oxyCODONE-acetaminophen (PERCOCET/ROXICET) 5-325 MG tablet  Every 4 hours PRN        11/02/20 2050           Perle Brickhouse, Eugene Gavia, PA-C 11/02/20 2241    Virgina Norfolk,  DO 11/03/20 0011

## 2020-11-02 NOTE — ED Triage Notes (Signed)
Pt reportsL flank pain, treated recently in ED for same. Reports pain meds and flomax not working.

## 2020-11-03 ENCOUNTER — Other Ambulatory Visit (HOSPITAL_COMMUNITY)
Admission: RE | Admit: 2020-11-03 | Discharge: 2020-11-03 | Disposition: A | Discharge status: Home / Self Care | Payer: Self-pay | Source: Ambulatory Visit | Attending: Urology | Admitting: Urology

## 2020-11-03 ENCOUNTER — Other Ambulatory Visit: Payer: Self-pay | Admitting: Urology

## 2020-11-03 DIAGNOSIS — Z20822 Contact with and (suspected) exposure to covid-19: Secondary | ICD-10-CM | POA: Insufficient documentation

## 2020-11-03 DIAGNOSIS — Z01818 Encounter for other preprocedural examination: Secondary | ICD-10-CM | POA: Insufficient documentation

## 2020-11-03 LAB — URINE CULTURE: Culture: NO GROWTH

## 2020-11-03 LAB — SARS CORONAVIRUS 2 (TAT 6-24 HRS): SARS Coronavirus 2: NEGATIVE

## 2020-11-03 NOTE — Progress Notes (Signed)
Patient to arrive at 1430 on 11/04/2020. History and medications reviewed. Pre-procedure instructions given. NPO after midnight tonight except for clear liquids until 1230. Driver secured.

## 2020-11-04 ENCOUNTER — Ambulatory Visit (HOSPITAL_COMMUNITY): Payer: Self-pay

## 2020-11-04 ENCOUNTER — Encounter (HOSPITAL_BASED_OUTPATIENT_CLINIC_OR_DEPARTMENT_OTHER)
Admission: RE | Disposition: A | Discharge status: Home / Self Care | Payer: Self-pay | Source: Home / Self Care | Attending: Urology

## 2020-11-04 ENCOUNTER — Ambulatory Visit (HOSPITAL_BASED_OUTPATIENT_CLINIC_OR_DEPARTMENT_OTHER)
Admission: RE | Admit: 2020-11-04 | Discharge: 2020-11-04 | Disposition: A | Discharge status: Home / Self Care | Payer: Self-pay | Attending: Urology | Admitting: Urology

## 2020-11-04 ENCOUNTER — Encounter (HOSPITAL_BASED_OUTPATIENT_CLINIC_OR_DEPARTMENT_OTHER): Payer: Self-pay | Admitting: Urology

## 2020-11-04 DIAGNOSIS — Z87442 Personal history of urinary calculi: Secondary | ICD-10-CM | POA: Insufficient documentation

## 2020-11-04 DIAGNOSIS — N201 Calculus of ureter: Secondary | ICD-10-CM | POA: Insufficient documentation

## 2020-11-04 HISTORY — PX: EXTRACORPOREAL SHOCK WAVE LITHOTRIPSY: SHX1557

## 2020-11-04 SURGERY — LITHOTRIPSY, ESWL
Anesthesia: LOCAL | Laterality: Left

## 2020-11-04 MED ORDER — SODIUM CHLORIDE 0.9 % IV SOLN: INTRAVENOUS | Status: DC

## 2020-11-04 MED ORDER — DIPHENHYDRAMINE HCL 25 MG PO CAPS
ORAL_CAPSULE | ORAL | Status: AC
  Filled 2020-11-04: qty 1

## 2020-11-04 MED ORDER — DIPHENHYDRAMINE HCL 25 MG PO CAPS
25.0000 mg | ORAL_CAPSULE | ORAL | Status: AC
  Administered 2020-11-04: 25 mg via ORAL

## 2020-11-04 MED ORDER — CIPROFLOXACIN HCL 500 MG PO TABS
ORAL_TABLET | ORAL | Status: AC
  Filled 2020-11-04: qty 1

## 2020-11-04 MED ORDER — CIPROFLOXACIN HCL 500 MG PO TABS
500.0000 mg | ORAL_TABLET | ORAL | Status: AC
  Administered 2020-11-04: 500 mg via ORAL

## 2020-11-04 MED ORDER — DIAZEPAM 5 MG PO TABS
10.0000 mg | ORAL_TABLET | ORAL | Status: AC
  Administered 2020-11-04: 10 mg via ORAL

## 2020-11-04 MED ORDER — DIAZEPAM 5 MG PO TABS
ORAL_TABLET | ORAL | Status: AC
  Filled 2020-11-04: qty 2

## 2020-11-04 NOTE — H&P (Signed)
See scanned H&P from [facility] 

## 2020-11-04 NOTE — Op Note (Signed)
[facility] Operative Note  Treating Physician: Rhoderick Moody, MD  Pre-op diagnosis: 4 mm left ureteral stone  Post-op diagnosis: Same   Procedure: Left ESWL  See [facility]jel[facility] Brenner OP note scanned into chart. Also because of the size, density, location and other factors that cannot be anticipated I feel this will likely be a staged procedure. This fact supersedes any indication in the scanned [place] [facility] operative note to the contrary

## 2020-11-04 NOTE — Progress Notes (Signed)
Completed a 1815

## 2020-11-05 ENCOUNTER — Encounter (HOSPITAL_BASED_OUTPATIENT_CLINIC_OR_DEPARTMENT_OTHER): Payer: Self-pay | Admitting: Urology

## 2021-01-24 ENCOUNTER — Emergency Department (HOSPITAL_BASED_OUTPATIENT_CLINIC_OR_DEPARTMENT_OTHER): Payer: Self-pay

## 2021-01-24 ENCOUNTER — Other Ambulatory Visit (HOSPITAL_COMMUNITY): Payer: Self-pay | Admitting: Emergency Medicine

## 2021-01-24 ENCOUNTER — Other Ambulatory Visit: Payer: Self-pay

## 2021-01-24 ENCOUNTER — Emergency Department (HOSPITAL_BASED_OUTPATIENT_CLINIC_OR_DEPARTMENT_OTHER)
Admission: EM | Admit: 2021-01-24 | Discharge: 2021-01-24 | Disposition: A | Discharge status: Home / Self Care | Payer: Self-pay | Attending: Emergency Medicine | Admitting: Emergency Medicine

## 2021-01-24 ENCOUNTER — Encounter (HOSPITAL_BASED_OUTPATIENT_CLINIC_OR_DEPARTMENT_OTHER): Payer: Self-pay

## 2021-01-24 DIAGNOSIS — N200 Calculus of kidney: Secondary | ICD-10-CM

## 2021-01-24 LAB — COMPREHENSIVE METABOLIC PANEL
ALT: 54 U/L — ABNORMAL HIGH (ref 0–44)
AST: 32 U/L (ref 15–41)
Albumin: 4.8 g/dL (ref 3.5–5.0)
Alkaline Phosphatase: 61 U/L (ref 38–126)
Anion gap: 9 (ref 5–15)
BUN: 19 mg/dL (ref 6–20)
CO2: 23 mmol/L (ref 22–32)
Calcium: 8.9 mg/dL (ref 8.9–10.3)
Chloride: 105 mmol/L (ref 98–111)
Creatinine, Ser: 1.48 mg/dL — ABNORMAL HIGH (ref 0.61–1.24)
GFR, Estimated: >60 mL/min (ref >60)
Glucose, Bld: 140 mg/dL — ABNORMAL HIGH (ref 70–99)
Potassium: 4 mmol/L (ref 3.5–5.1)
Sodium: 137 mmol/L (ref 135–145)
Total Bilirubin: 1.5 mg/dL — ABNORMAL HIGH (ref 0.3–1.2)
Total Protein: 7.4 g/dL (ref 6.5–8.1)

## 2021-01-24 LAB — CBC WITH DIFFERENTIAL/PLATELET
Abs Immature Granulocytes: 0.03 10*3/uL (ref 0.00–0.07)
Basophils Absolute: 0 10*3/uL (ref 0.0–0.1)
Basophils Relative: 0 %
Eosinophils Absolute: 0 10*3/uL (ref 0.0–0.5)
Eosinophils Relative: 0 %
HCT: 47.5 % (ref 39.0–52.0)
Hemoglobin: 16.2 g/dL (ref 13.0–17.0)
Immature Granulocytes: 0 %
Lymphocytes Relative: 16 %
Lymphs Abs: 1.8 10*3/uL (ref 0.7–4.0)
MCH: 28.8 pg (ref 26.0–34.0)
MCHC: 34.1 g/dL (ref 30.0–36.0)
MCV: 84.4 fL (ref 80.0–100.0)
Monocytes Absolute: 0.7 10*3/uL (ref 0.1–1.0)
Monocytes Relative: 6 %
Neutro Abs: 8.7 10*3/uL — ABNORMAL HIGH (ref 1.7–7.7)
Neutrophils Relative %: 78 %
Platelets: 207 10*3/uL (ref 150–400)
RBC: 5.63 MIL/uL (ref 4.22–5.81)
RDW: 12.8 % (ref 11.5–15.5)
WBC: 11.3 10*3/uL — ABNORMAL HIGH (ref 4.0–10.5)
nRBC: 0 % (ref 0.0–0.2)

## 2021-01-24 LAB — URINALYSIS, MICROSCOPIC (REFLEX)

## 2021-01-24 LAB — URINALYSIS, ROUTINE W REFLEX MICROSCOPIC
Bilirubin Urine: NEGATIVE
Glucose, UA: NEGATIVE mg/dL
Ketones, ur: NEGATIVE mg/dL
Leukocytes,Ua: NEGATIVE
Nitrite: NEGATIVE
Protein, ur: NEGATIVE mg/dL
Specific Gravity, Urine: 1.015 (ref 1.005–1.030)
pH: 7 (ref 5.0–8.0)

## 2021-01-24 LAB — LIPASE, BLOOD: Lipase: 24 U/L (ref 11–51)

## 2021-01-24 MED ORDER — OXYCODONE HCL 5 MG PO TABS
5.0000 mg | ORAL_TABLET | Freq: Four times a day (QID) | ORAL | 0 refills | Status: DC | PRN
Start: 2021-01-24 — End: 2021-01-24

## 2021-01-24 MED ORDER — FENTANYL CITRATE (PF) 100 MCG/2ML IJ SOLN
50.0000 ug | Freq: Once | INTRAMUSCULAR | Status: AC
  Administered 2021-01-24: 50 ug via INTRAVENOUS
  Filled 2021-01-24: qty 2

## 2021-01-24 MED ORDER — KETOROLAC TROMETHAMINE 30 MG/ML IJ SOLN
30.0000 mg | Freq: Once | INTRAMUSCULAR | Status: AC
  Administered 2021-01-24: 30 mg via INTRAVENOUS
  Filled 2021-01-24: qty 1

## 2021-01-24 MED ORDER — TAMSULOSIN HCL 0.4 MG PO CAPS: 0.4000 mg | ORAL_CAPSULE | Freq: Every day | ORAL | 0 refills | Status: AC

## 2021-01-24 MED ORDER — OXYCODONE HCL 5 MG PO TABS
5.0000 mg | ORAL_TABLET | Freq: Four times a day (QID) | ORAL | 0 refills | Status: DC | PRN
Start: 2021-01-24 — End: 2023-02-14

## 2021-01-24 MED ORDER — ONDANSETRON HCL 4 MG PO TABS: 4.0000 mg | ORAL_TABLET | Freq: Four times a day (QID) | ORAL | 0 refills | Status: DC

## 2021-01-24 MED ORDER — MORPHINE SULFATE (PF) 4 MG/ML IV SOLN
4.0000 mg | Freq: Once | INTRAVENOUS | Status: AC
  Administered 2021-01-24: 4 mg via INTRAVENOUS
  Filled 2021-01-24: qty 1

## 2021-01-24 MED ORDER — SODIUM CHLORIDE 0.9 % IV BOLUS
1000.0000 mL | Freq: Once | INTRAVENOUS | Status: AC
  Administered 2021-01-24: 1000 mL via INTRAVENOUS

## 2021-01-24 MED ORDER — ONDANSETRON HCL 4 MG/2ML IJ SOLN
4.0000 mg | Freq: Once | INTRAMUSCULAR | Status: AC
  Administered 2021-01-24: 4 mg via INTRAVENOUS
  Filled 2021-01-24: qty 2

## 2021-01-24 MED FILL — ONDANSETRON HCL 4 MG TABLET: 4 | 3 days supply | Qty: 12 | Fill #0

## 2021-01-24 MED FILL — oxyCODONE HCL 5 MG TABS: 5 | 5 days supply | Qty: 20 | Fill #0

## 2021-01-24 MED FILL — TAMSULOSIN HCL 0.4 MG CAP: 0.4 | 7 days supply | Qty: 7 | Fill #0

## 2021-01-24 NOTE — ED Provider Notes (Signed)
[facility] EMERGENCY DEPARTMENT Provider Note   CSN: [id number] Arrival date & time: 01/24/21  5621     History Chief Complaint  Patient presents with  . Flank Pain    Austin Heath is a 35 y.o. male.  Hx of kidney stones, left flank pain similar to prior stones.  The history is provided by the patient.  Flank Pain This is a new problem. The current episode started 6 to 12 hours ago. The problem occurs constantly. The problem has not changed since onset.Associated symptoms include abdominal pain. Pertinent negatives include no chest pain, no headaches and no shortness of breath. Nothing aggravates the symptoms. Nothing relieves the symptoms. He has tried nothing for the symptoms. The treatment provided no relief.       Past Medical History:  Diagnosis Date  . Kidney stones     There are no problems to display for this patient.   Past Surgical History:  Procedure Laterality Date  . EXTRACORPOREAL SHOCK WAVE LITHOTRIPSY Left 11/04/2020   Procedure: EXTRACORPOREAL SHOCK WAVE LITHOTRIPSY ([facility]);  Surgeon: Rene Paci, MD;  Location: [facility];  Service: Urology;  Laterality: Left;  . LACERATION REPAIR         History reviewed. No pertinent family history.  Social History   Tobacco Use  . Smoking status: Never Smoker  . Smokeless tobacco: Never Used  Substance Use Topics  . Alcohol use: No  . Drug use: No    Home Medications Prior to Admission medications   Medication Sig Start Date End Date Taking? Authorizing Provider  ondansetron (ZOFRAN ODT) 4 MG disintegrating tablet Take 1 tablet (4 mg total) by mouth every 8 (eight) hours as needed for nausea or vomiting. 10/28/20   Venter, Margaux, PA-C  ondansetron (ZOFRAN) 4 MG tablet Take 1 tablet (4 mg total) by mouth every 6 (six) hours. 01/24/21   Brodi Nery, DO  oxyCODONE (ROXICODONE) 5 MG immediate release tablet Take 1 tablet (5 mg total) by mouth every 6 (six) hours  as needed for up to 20 doses for breakthrough pain. 01/24/21   Carsen Leaf, DO  oxyCODONE-acetaminophen (PERCOCET/ROXICET) 5-325 MG tablet Take 1 tablet by mouth every 4 (four) hours as needed for severe pain. 11/02/20   Sponseller, Eugene Gavia, PA-C  tamsulosin (FLOMAX) 0.4 MG CAPS capsule Take 1 capsule (0.4 mg total) by mouth daily for 7 days. 01/24/21 01/31/21 Yes Keryn Nessler, DO    Allergies    Hydrocodone and Penicillins  Review of Systems   Review of Systems  Constitutional: Negative for chills and fever.  HENT: Negative for ear pain and sore throat.   Eyes: Negative for pain and visual disturbance.  Respiratory: Negative for cough and shortness of breath.   Cardiovascular: Negative for chest pain and palpitations.  Gastrointestinal: Positive for abdominal pain. Negative for vomiting.  Genitourinary: Positive for flank pain. Negative for dysuria and hematuria.  Musculoskeletal: Negative for arthralgias and back pain.  Skin: Negative for color change and rash.  Neurological: Negative for seizures, syncope and headaches.  All other systems reviewed and are negative.   Physical Exam Updated Vital Signs  ED Triage Vitals  Enc Vitals Group     BP 01/24/21 0955 138/84     Pulse Rate 01/24/21 0955 (!) 58     Resp 01/24/21 0955 18     Temp 01/24/21 0955 97.8 F (36.6 C)     Temp Source 01/24/21 0955 Oral     SpO2 01/24/21 0955 100 %  Weight 01/24/21 0955 262 lb (118.8 kg)     Height 01/24/21 0955 6\' 4"  (1.93 m)     Head Circumference --      Peak Flow --      Pain Score 01/24/21 0954 10     Pain Loc --      Pain Edu? --      Excl. in GC? --     Physical Exam Vitals and nursing note reviewed.  Constitutional:      General: He is not in acute distress.    Appearance: He is well-developed and well-nourished. He is not ill-appearing.  HENT:     Head: Normocephalic and atraumatic.  Eyes:     Extraocular Movements: Extraocular movements intact.      Conjunctiva/sclera: Conjunctivae normal.     Pupils: Pupils are equal, round, and reactive to light.  Cardiovascular:     Rate and Rhythm: Normal rate and regular rhythm.     Pulses: Normal pulses.     Heart sounds: Normal heart sounds. No murmur heard.   Pulmonary:     Effort: Pulmonary effort is normal. No respiratory distress.     Breath sounds: Normal breath sounds.  Abdominal:     Palpations: Abdomen is soft.     Tenderness: There is no abdominal tenderness. There is left CVA tenderness.  Musculoskeletal:        General: Tenderness (left cva tenderness) present. No edema. Normal range of motion.     Cervical back: Normal range of motion and neck supple.  Skin:    General: Skin is warm and dry.     Capillary Refill: Capillary refill takes less than 2 seconds.  Neurological:     Mental Status: He is alert.  Psychiatric:        Mood and Affect: Mood and affect normal.     ED Results / Procedures / Treatments   Labs (all labs ordered are listed, but only abnormal results are displayed) Labs Reviewed  CBC WITH DIFFERENTIAL/PLATELET - Abnormal; Notable for the following components:      Result Value   WBC 11.3 (*)    Neutro Abs 8.7 (*)    All other components within normal limits  COMPREHENSIVE METABOLIC PANEL - Abnormal; Notable for the following components:   Glucose, Bld 140 (*)    Creatinine, Ser 1.48 (*)    ALT 54 (*)    Total Bilirubin 1.5 (*)    All other components within normal limits  URINALYSIS, ROUTINE W REFLEX MICROSCOPIC - Abnormal; Notable for the following components:   Hgb urine dipstick SMALL (*)    All other components within normal limits  URINALYSIS, MICROSCOPIC (REFLEX) - Abnormal; Notable for the following components:   Bacteria, UA RARE (*)    All other components within normal limits  LIPASE, BLOOD    EKG None  Radiology CT Renal Stone Study  Result Date: 01/24/2021 CLINICAL DATA:  hx of kidney stones, had one in November. Starting this  morning at 0200, started having left flank and abdominal pain with nausea and vomiting. Pain comes in waves. Denies urinary symptoms. EXAM: CT ABDOMEN AND PELVIS WITHOUT CONTRAST TECHNIQUE: Multidetector CT imaging of the abdomen and pelvis was performed following the standard protocol without IV contrast. COMPARISON:  CT abdomen pelvis October 28, 2020. FINDINGS: Lower chest: No acute abnormality. Normal size heart. No pericardial effusion. Small hiatal hernia. Hepatobiliary: Diffuse hepatic steatosis with some sparing along the gallbladder fossa. Gallbladder is unremarkable. No biliary ductal dilatation. Pancreas:  Unremarkable Spleen: Unremarkable Adrenals/Urinary Tract: Bilateral adrenal glands are unremarkable. There is left hydroureteronephrosis extending to a 4 mm stone at the L3/L4 vertebral body in the mid ureter (series 2, image 49). Punctate left lower pole nonobstructive renal calculus. No right-sided hydronephrosis or nephrolithiasis. Unremarkable predominantly decompressed appearance of the urinary bladder. Stomach/Bowel: Small hiatal hernia otherwise the stomach appears unremarkable for the degree of distension. No suspicious small bowel wall thickening or dilation visualized. The appendix and terminal ileum appear within normal limits. Colonic diverticulosis without findings of acute diverticulitis. Vascular/Lymphatic: No significant vascular findings are present. No enlarged abdominal or pelvic lymph nodes. Reproductive: Prostate is unremarkable.  Vasectomy clips. Other: No abdominopelvic ascites. Musculoskeletal: No acute osseous abnormality. IMPRESSION: 1. LEFT-sided hydroureteronephrosis to the level of a 4 mm stone at the L3/L4 vertebral body level in the mid ureter. 2. Punctate left lower pole nonobstructive renal calculus. 3. Diffuse hepatic steatosis. 4. Colonic diverticulosis without findings of acute diverticulitis. Electronically Signed   By: Maudry Mayhew MD   On: 01/24/2021 11:06     Procedures Procedures   Medications Ordered in ED Medications  sodium chloride 0.9 % bolus 1,000 mL (0 mLs Intravenous Stopped 01/24/21 1117)  fentaNYL (SUBLIMAZE) injection 50 mcg (50 mcg Intravenous Given 01/24/21 1010)  ondansetron (ZOFRAN) injection 4 mg (4 mg Intravenous Given 01/24/21 1010)  morphine 4 MG/ML injection 4 mg (4 mg Intravenous Given 01/24/21 1117)  ketorolac (TORADOL) 30 MG/ML injection 30 mg (30 mg Intravenous Given 01/24/21 1152)    ED Course  I have reviewed the triage vital signs and the nursing notes.  Pertinent labs & imaging results that were available during my care of the patient were reviewed by me and considered in my medical decision making (see chart for details).    MDM Rules/Calculators/A&P                          Austin Heath is a 35 year old male who presents the ED with left flank pain.  History of kidney stone.  Denies any urinary symptoms.  No hematuria.  Doubt small bowel obstruction and suspect pyelonephritis versus kidney stone.  CT scan confirmed left-sided kidney stone.  4 mm.  Creatinine 1.48.  White count 11.3.  However no fever.  Overall urinalysis appears negative for infection.  Overall patient feeling better after IV fluids, IV pain medication.  Will prescribe Flomax, oxycodone, Zofran.  Recommend follow-up with urology.  Understands return precautions.  This chart was dictated using voice recognition software.  Despite best efforts to proofread,  errors can occur which can change the documentation meaning.      Final Clinical Impression(s) / ED Diagnoses Final diagnoses:  Kidney stone    Rx / DC Orders ED Discharge Orders         Ordered    oxyCODONE (ROXICODONE) 5 MG immediate release tablet  Every 6 hours PRN,   Status:  Discontinued        01/24/21 1135    ondansetron (ZOFRAN) 4 MG tablet  Every 6 hours,   Status:  Discontinued        01/24/21 1135    ondansetron (ZOFRAN) 4 MG tablet  Every 6 hours        01/24/21  1136    oxyCODONE (ROXICODONE) 5 MG immediate release tablet  Every 6 hours PRN        01/24/21 1136    tamsulosin (FLOMAX) 0.4 MG CAPS capsule  Daily  01/24/21 1142           Virgina Norfolk, DO 01/24/21 1224

## 2021-01-24 NOTE — ED Triage Notes (Signed)
Pt has hx of kidney stones, had one in November. Starting this morning at 0200, started having left flank and abdominal pain with nausea and vomiting. Pain comes in waves. Denies urinary symptoms.

## 2022-01-31 ENCOUNTER — Emergency Department (HOSPITAL_BASED_OUTPATIENT_CLINIC_OR_DEPARTMENT_OTHER)
Admission: EM | Admit: 2022-01-31 | Discharge: 2022-01-31 | Disposition: A | Discharge status: Home / Self Care | Payer: Self-pay | Attending: Emergency Medicine | Admitting: Emergency Medicine

## 2022-01-31 ENCOUNTER — Other Ambulatory Visit: Payer: Self-pay

## 2022-01-31 ENCOUNTER — Encounter (HOSPITAL_BASED_OUTPATIENT_CLINIC_OR_DEPARTMENT_OTHER): Payer: Self-pay

## 2022-01-31 ENCOUNTER — Emergency Department (HOSPITAL_BASED_OUTPATIENT_CLINIC_OR_DEPARTMENT_OTHER): Payer: Self-pay

## 2022-01-31 DIAGNOSIS — M545 Low back pain, unspecified: Secondary | ICD-10-CM | POA: Insufficient documentation

## 2022-01-31 DIAGNOSIS — R0602 Shortness of breath: Secondary | ICD-10-CM | POA: Insufficient documentation

## 2022-01-31 DIAGNOSIS — R1031 Right lower quadrant pain: Secondary | ICD-10-CM | POA: Insufficient documentation

## 2022-01-31 DIAGNOSIS — Z20822 Contact with and (suspected) exposure to covid-19: Secondary | ICD-10-CM | POA: Insufficient documentation

## 2022-01-31 DIAGNOSIS — R109 Unspecified abdominal pain: Secondary | ICD-10-CM

## 2022-01-31 DIAGNOSIS — R0789 Other chest pain: Secondary | ICD-10-CM | POA: Insufficient documentation

## 2022-01-31 LAB — URINALYSIS, ROUTINE W REFLEX MICROSCOPIC
Bilirubin Urine: NEGATIVE
Glucose, UA: NEGATIVE mg/dL
Hgb urine dipstick: NEGATIVE
Ketones, ur: NEGATIVE mg/dL
Leukocytes,Ua: NEGATIVE
Nitrite: NEGATIVE
Protein, ur: NEGATIVE mg/dL
Specific Gravity, Urine: 1.025 (ref 1.005–1.030)
pH: 7 (ref 5.0–8.0)

## 2022-01-31 LAB — COMPREHENSIVE METABOLIC PANEL
ALT: 71 U/L — ABNORMAL HIGH (ref 0–44)
AST: 34 U/L (ref 15–41)
Albumin: 4.5 g/dL (ref 3.5–5.0)
Alkaline Phosphatase: 60 U/L (ref 38–126)
Anion gap: 7 (ref 5–15)
BUN: 11 mg/dL (ref 6–20)
CO2: 27 mmol/L (ref 22–32)
Calcium: 9.2 mg/dL (ref 8.9–10.3)
Chloride: 102 mmol/L (ref 98–111)
Creatinine, Ser: 1.11 mg/dL (ref 0.61–1.24)
GFR, Estimated: >60 mL/min (ref >60)
Glucose, Bld: 101 mg/dL — ABNORMAL HIGH (ref 70–99)
Potassium: 3.6 mmol/L (ref 3.5–5.1)
Sodium: 136 mmol/L (ref 135–145)
Total Bilirubin: 1.9 mg/dL — ABNORMAL HIGH (ref 0.3–1.2)
Total Protein: 7.7 g/dL (ref 6.5–8.1)

## 2022-01-31 LAB — CBC WITH DIFFERENTIAL/PLATELET
Abs Immature Granulocytes: 0.02 10*3/uL (ref 0.00–0.07)
Basophils Absolute: 0.1 10*3/uL (ref 0.0–0.1)
Basophils Relative: 1 %
Eosinophils Absolute: 0.2 10*3/uL (ref 0.0–0.5)
Eosinophils Relative: 3 %
HCT: 50.7 % (ref 39.0–52.0)
Hemoglobin: 17.4 g/dL — ABNORMAL HIGH (ref 13.0–17.0)
Immature Granulocytes: 0 %
Lymphocytes Relative: 34 %
Lymphs Abs: 2.6 10*3/uL (ref 0.7–4.0)
MCH: 28.9 pg (ref 26.0–34.0)
MCHC: 34.3 g/dL (ref 30.0–36.0)
MCV: 84.2 fL (ref 80.0–100.0)
Monocytes Absolute: 0.8 10*3/uL (ref 0.1–1.0)
Monocytes Relative: 10 %
Neutro Abs: 4.1 10*3/uL (ref 1.7–7.7)
Neutrophils Relative %: 52 %
Platelets: 182 10*3/uL (ref 150–400)
RBC: 6.02 MIL/uL — ABNORMAL HIGH (ref 4.22–5.81)
RDW: 14.1 % (ref 11.5–15.5)
WBC: 7.8 10*3/uL (ref 4.0–10.5)
nRBC: 0 % (ref 0.0–0.2)

## 2022-01-31 LAB — RESP PANEL BY RT-PCR (FLU A&B, COVID) ARPGX2
Influenza A by PCR: NEGATIVE
Influenza B by PCR: NEGATIVE
SARS Coronavirus 2 by RT PCR: NEGATIVE

## 2022-01-31 LAB — TROPONIN I (HIGH SENSITIVITY): Troponin I (High Sensitivity): <2 ng/L (ref <18)

## 2022-01-31 LAB — LIPASE, BLOOD: Lipase: 35 U/L (ref 11–51)

## 2022-01-31 MED ORDER — IOHEXOL 350 MG/ML SOLN
75.0000 mL | Freq: Once | INTRAVENOUS | Status: AC | PRN
  Administered 2022-01-31: 100 mL via INTRAVENOUS

## 2022-01-31 MED ORDER — IBUPROFEN 600 MG PO TABS: 600.0000 mg | ORAL_TABLET | Freq: Four times a day (QID) | ORAL | 0 refills | Status: DC | PRN

## 2022-01-31 MED ORDER — AZITHROMYCIN 250 MG PO TABS: 250.0000 mg | ORAL_TABLET | Freq: Every day | ORAL | 0 refills | Status: DC

## 2022-01-31 MED ORDER — METHOCARBAMOL 500 MG PO TABS
500.0000 mg | ORAL_TABLET | Freq: Two times a day (BID) | ORAL | 0 refills | Status: DC | PRN
Start: 2022-01-31 — End: 2022-10-25

## 2022-01-31 NOTE — ED Notes (Signed)
Pt in bed pt states that he has had some R sided chest pain for the past three weeks, states that he has also been having some R lower back pain, states that he has seen a urologist and they have done a ct, but pt continues to have pain.  Denies numbness or tingling in legs, moving all extremities.

## 2022-01-31 NOTE — Discharge Instructions (Addendum)
You were seen here today for evaluation of your chest pain, shortness of breath, and lower back pain for the past 1 to 2 months.  Your lab work was normal other than some mildly elevated bilirubin and LFTs which I would discuss with your primary care provider.  The CT image of your chest showed a 7 mm pulmonary nodule noted in the right lower lobe adjacent to the major Fissure. Non-contrast chest CT at 6-12 months is recommended. If the nodule is stable at time of repeat CT, then future CT at 18-24 months (from today's scan) is considered optional for low-risk patients.   I have attached the information to [facility] health and wellness which is a primary care provider.  Please call to schedule an appointment to further discuss these pulmonary nodule and slightly elevated total bili and LFTs.  We will also need to schedule your CT scan in 6 to 12 months to further surveilled the pulmonary nodule.  Additionally, I have included the information to pulmonology for you to call to schedule an appointment with based on your CT scan and the shortness of breath with this atypical process seen in your lungs.  For your symptoms, I have prescribed you ibuprofen and Robaxin to take as needed for pain.  Please do not take Robaxin while operating heavy machinery or driving or making important decisions as this can make you tired and fatigued.  Additionally, I have prescribed you azithromycin, an antibiotic, to take for the shortness of breath and atypical processes in your lungs.  Please take this medication as directed and complete the entirety of the course.  If you have any worsening shortness of breath, chest pain, lightheadedness, worsening abdominal pain, nausea, vomiting, dark or tarry stools, dysuria, hematuria, or fever, please return the nearest emergency department for evaluation.

## 2022-01-31 NOTE — ED Provider Notes (Signed)
[name] Provider Note   CSN: [id number] Arrival date & time: 01/31/22  1348     History Chief Complaint  Patient presents with   Chest Pain    Austin Heath is a 36 y.o. male kidney stones presents emerged department for evaluation of right lower back pain radiating to right lower quadrant for the past 2 months.  Patient reports it feels like a constant stabbing pain.  Additionally, he mentions right-sided chest pain for the past month that is also constant and stabbing and hurts worse with movement.  He also reports some shortness of breath as well.  Denies any nausea, vomiting, diarrhea, constipation, dysuria, hematuria, any scrotal penile or testicular swelling or pain, or any fever.  The patient reports he was seen by his urologist for this is about is not a kidney stone, but it is CT scan did not show any.  He denies any heavy lifting, traumas, falls, or MVC's.  The patient does work Architect for living.  He has had surgery on his right arm before.  No daily medications.  No known drug allergies.  Denies any smoking, EtOH, illicit drugs.   Chest Pain Associated symptoms: abdominal pain, back pain and shortness of breath   Associated symptoms: no cough, no fever, no headache, no nausea, no vomiting and no weakness       Home Medications Prior to Admission medications   Medication Sig Start Date End Date Taking? Authorizing Provider  ondansetron (ZOFRAN ODT) 4 MG disintegrating tablet Take 1 tablet (4 mg total) by mouth every 8 (eight) hours as needed for nausea or vomiting. 10/28/20   Venter, Margaux, PA-C  ondansetron (ZOFRAN) 4 MG tablet Take 1 tablet (4 mg total) by mouth every 6 (six) hours. 01/24/21   Curatolo, Adam, DO  oxyCODONE (ROXICODONE) 5 MG immediate release tablet Take 1 tablet (5 mg total) by mouth every 6 (six) hours as needed for up to 20 doses for breakthrough pain. 01/24/21   Curatolo, Adam, DO  oxyCODONE-acetaminophen  (PERCOCET/ROXICET) 5-325 MG tablet Take 1 tablet by mouth every 4 (four) hours as needed for severe pain. 11/02/20   Sponseller, Gypsy Balsam, PA-C      Allergies    Hydrocodone and Penicillins    Review of Systems   Review of Systems  Constitutional:  Negative for chills and fever.  HENT:  Negative for congestion, rhinorrhea and sore throat.   Respiratory:  Positive for shortness of breath. Negative for cough.   Cardiovascular:  Positive for chest pain. Negative for leg swelling.  Gastrointestinal:  Positive for abdominal pain. Negative for anal bleeding, blood in stool, constipation, diarrhea, nausea and vomiting.  Genitourinary:  Positive for flank pain. Negative for dysuria, frequency, hematuria, penile discharge, penile pain, penile swelling, scrotal swelling, testicular pain and urgency.  Musculoskeletal:  Positive for back pain.  Neurological:  Negative for weakness and headaches.   Physical Exam Updated Vital Signs BP (!) 143/69    Pulse 79    Temp 98.5 F (36.9 C) (Oral)    Resp 14    Ht 6\' 4"  (1.93 m)    Wt 120.7 kg    SpO2 99%    BMI 32.38 kg/m  Physical Exam Vitals and nursing note reviewed.  Constitutional:      General: He is not in acute distress.    Appearance: Normal appearance. He is not ill-appearing or toxic-appearing.  HENT:     Head: Normocephalic and atraumatic.  Eyes:  General: No scleral icterus. Cardiovascular:     Rate and Rhythm: Normal rate and regular rhythm.     Pulses:          Carotid pulses are 2+ on the right side and 2+ on the left side.      Radial pulses are 2+ on the right side and 2+ on the left side.       Dorsalis pedis pulses are 2+ on the right side and 2+ on the left side.       Posterior tibial pulses are 2+ on the right side and 2+ on the left side.     Heart sounds: Normal heart sounds.  Pulmonary:     Effort: Pulmonary effort is normal. No tachypnea, accessory muscle usage or respiratory distress.     Breath sounds: Normal  breath sounds. No decreased breath sounds or wheezing.     Comments: Clear to auscultation bilaterally.  No respiratory distress, accessory muscle use, nasal flaring, tripoding, or cyanosis present.  Patient speaking in full sentences with ease.  Satting 98% on room air with no increased work of breathing. Chest:     Chest wall: Tenderness present.     Comments: Tenderness to the right chest near sternum.  No crepitus or obvious deformity or step-off visualized or palpated.  No overlying skin changes noted. Abdominal:     General: Abdomen is flat. Bowel sounds are normal.     Palpations: Abdomen is soft.     Tenderness: There is no abdominal tenderness. There is no guarding or rebound.     Comments: No abdominal tenderness to palpation.  Normal active bowel sounds.  Soft.  No overlying skin changes present.  Musculoskeletal:        General: No deformity.     Cervical back: Normal range of motion.     Right lower leg: No tenderness. No edema.     Left lower leg: No tenderness. No edema.     Comments: No midline or paraspinal cervical, thoracic, or lumbar tenderness palpation.  No obvious step-offs or deformities.  No increased warmth to the spine or surrounding areas.  No overlying skin changes noted.  The patient has point tenderness to the R flank. Palpable muscle spasm. No overlying erythema or rash.   Leg compartments soft.  Skin:    General: Skin is warm and dry.     Capillary Refill: Capillary refill takes less than 2 seconds.  Neurological:     General: No focal deficit present.     Mental Status: He is alert. Mental status is at baseline.     Motor: No weakness.    ED Results / Procedures / Treatments   Labs (all labs ordered are listed, but only abnormal results are displayed) Labs Reviewed  COMPREHENSIVE METABOLIC PANEL - Abnormal; Notable for the following components:      Result Value   Glucose, Bld 101 (*)    ALT 71 (*)    Total Bilirubin 1.9 (*)    All other  components within normal limits  CBC WITH DIFFERENTIAL/PLATELET - Abnormal; Notable for the following components:   RBC 6.02 (*)    Hemoglobin 17.4 (*)    All other components within normal limits  URINALYSIS, ROUTINE W REFLEX MICROSCOPIC - Abnormal; Notable for the following components:   APPearance CLOUDY (*)    All other components within normal limits  LIPASE, BLOOD  TROPONIN I (HIGH SENSITIVITY)    EKG EKG Interpretation  Date/Time:  Tuesday January 31 2022 14:03:07 EST Ventricular Rate:  86 PR Interval:  166 QRS Duration: 96 QT Interval:  352 QTC Calculation: 421 R Axis:   61 Text Interpretation: Sinus rhythm Confirmed by Lennice Sites ([id number]) on 01/31/2022 2:05:08 PM  Radiology CT Angio Chest PE W and/or Wo Contrast  Result Date: 01/31/2022 CLINICAL DATA:  Right-sided chest pain for 3-4 weeks. EXAM: CT ANGIOGRAPHY CHEST WITH CONTRAST TECHNIQUE: Multidetector CT imaging of the chest was performed using the standard protocol during bolus administration of intravenous contrast. Multiplanar CT image reconstructions and MIPs were obtained to evaluate the vascular anatomy. RADIATION DOSE REDUCTION: This exam was performed according to the departmental dose-optimization program which includes automated exposure control, adjustment of the mA and/or kV according to patient size and/or use of iterative reconstruction technique. CONTRAST:  173mL OMNIPAQUE IOHEXOL 350 MG/ML SOLN COMPARISON:  None. FINDINGS: Cardiovascular: The heart is normal in size. No pericardial effusion. The aorta is normal in caliber. No dissection or atherosclerotic calcification. The branch vessels are patent. No coronary artery calcifications. The pulmonary arterial tree is fairly well opacified. No filling defects to suggest pulmonary embolism. Mediastinum/Nodes: Borderline enlarged mediastinal and hilar nodes likely reactive/inflammatory. The esophagus is grossly. Lungs/Pleura: Patchy ground-glass opacity most notably  in the lower lobes bilaterally suggesting an inflammatory or atypical infectious process. No focal airspace consolidation. No pleural effusions. 7 mm pulmonary nodule noted in the right lower lobe adjacent to the major fissure on image 53/5. Non-contrast chest CT at 6-12 months is recommended. If the nodule is stable at time of repeat CT, then future CT at 18-24 months (from today's scan) is considered optional for low-risk patients, but is recommended for high-risk patients. This recommendation follows the consensus statement: Guidelines for Management of Incidental Pulmonary Nodules Detected on CT Images: From the Fleischner Society 2017; Radiology 2017; 284:228-243. Upper Abdomen: No significant upper abdominal findings. Musculoskeletal: No significant bony findings. Review of the MIP images confirms the above findings. IMPRESSION: 1. No CT findings for pulmonary embolism. 2. Normal thoracic aorta. 3. Patchy ground-glass opacity most notably in the lower lobes bilaterally suggesting an inflammatory or atypical infectious process. 4. 7 mm pulmonary nodule in the right lower lobe. Non-contrast chest CT at 6-12 months is recommended. Electronically Signed   By: Marijo Sanes M.D.   On: 01/31/2022 18:20   DG Chest Portable 1 View  Result Date: 01/31/2022 CLINICAL DATA:  Right-sided chest pain. EXAM: PORTABLE CHEST 1 VIEW COMPARISON:  None. FINDINGS: The heart size and mediastinal contours are within normal limits. Both lungs are clear. The visualized skeletal structures are unremarkable. IMPRESSION: No active disease. Electronically Signed   By: Kerby Moors M.D.   On: 01/31/2022 14:54    Procedures Procedures   Medications Ordered in ED Medications  iohexol (OMNIPAQUE) 350 MG/ML injection 75 mL (100 mLs Intravenous Contrast Given 01/31/22 1752)    ED Course/ Medical Decision Making/ A&P                           Medical Decision Making Amount and/or Complexity of Data Reviewed Labs:  ordered. Radiology: ordered.  Risk Prescription drug management.   36 year old male presents emerged department for evaluation of right lower back pain and right chest pain ongoing for the past 1-2 months.  Differential diagnosis includes was not limited to musculoskeletal, costochondritis, ACS, PE, pneumonia, pneumothorax, renal stone, dissection.  Vital signs are stable and unremarkable.  Patient normotensive, afebrile, normal pulse rate, satting 98% on room air  with no increased work of breathing.  Physical exam is pertinent for some right-sided chest tenderness without any crepitus or obvious deformity or step-off noted or palpated.  No overlying skin changes noted.  Patient does not have any abdominal tenderness.  Normal active bowel sounds.  Abdomen soft without any overlying skin changes.  He has some tenderness to the right lower flank with a palpable muscle spasm without any overlying skin changes or increased warmth.  Palpable DP, PT, and radial pulses.  Carotids palpable as well.  The patient is in no acute distress and is well-appearing.  Lungs are clear to auscultation bilaterally he is satting well on room air without any increased work of breathing.  There is no edema to his bilateral lower legs or any tenderness.  After discussion with my attending, labs and imaging ordered.  I independently reviewed and interpreted the patient's labs and imaging and agree with the radiologist interpretation.  CBC shows mildly increased hemoglobin at 17.4, but normal hematocrit.  No leukocytosis.  Lipase normal.  Troponin less than 2.  CMP shows mildly elevated glucose at 101.  Elevated ALT at 71.  Mildly elevated total bili at 1.9.  Urinalysis shows cloudy urine otherwise normal.  Normal super gravity.  No nitrates or leukocytes present.  No blood or bilirubin present.  Chest x-ray shows no active cardiopulmonary process.  CT angio chest shows no CT findings for pulmonary embolism. Normal thoracic aorta.  Patchy ground-glass opacity most notably in the lower lobes bilaterally suggesting an inflammatory or atypical infectious process. 7 mm pulmonary nodule in the right lower lobe. Non-contrast chest CT at 6-12 months is recommended..  EKG shows normal sinus rhythm. COVID and flu pending.  HEAR score of 1. With following the pathway, the patient can be discharged home from a ACS standpoint.  Low station for any PE or pneumothorax given the CT and chest x-ray findings.  Low suspicion for any dissection given the CT.  Low suspicion for any renal stone given the patient has had pain ongoing for 2 months and had a scan during this time from urology that was normal.  I discussed the lab and imaging findings with the patient.  Recommended the patient get established with a PCP he says he is able to get a repeat of a CT to further surveilled the 7 mm pulmonary nodule in the right lower lobe as well as his slightly elevated LFTs and total bilirubin. Referral for Pulmonology given as well.   For his symptoms, will given Ibuprofen and Robaxin for the patient's MSK complaints. For the atypical process seen at the bases of the bilateral lungs, will start the patient on azithromycin.  Return precautions discussed.  Patient agrees to plan.  Patient is stable being discharged home in good condition.   I discussed this case with my attending physician who cosigned this note including patient's presenting symptoms, physical exam, and planned diagnostics and interventions. Attending physician stated agreement with plan or made changes to plan which were implemented.   Final Clinical Impression(s) / ED Diagnoses Final diagnoses:  Chest wall pain  Right flank pain  Shortness of breath    Rx / DC Orders ED Discharge Orders          Ordered    methocarbamol (ROBAXIN) 500 MG tablet  2 times daily PRN        01/31/22 1936    ibuprofen (ADVIL) 600 MG tablet  Every 6 hours PRN  01/31/22 1936    azithromycin  (ZITHROMAX) 250 MG tablet  Daily        01/31/22 1936              Sherrell Puller, Hershal Coria 01/31/22 2007    Gareth Morgan, MD 02/01/22 1445

## 2022-01-31 NOTE — ED Triage Notes (Signed)
Pt c/o right CP x 3-4 week-also c/o right side abd/flank pain x 2 months-NAD-steady gait

## 2022-02-08 ENCOUNTER — Other Ambulatory Visit: Payer: Self-pay

## 2022-02-08 ENCOUNTER — Ambulatory Visit (INDEPENDENT_AMBULATORY_CARE_PROVIDER_SITE_OTHER): Payer: Self-pay | Admitting: Pulmonary Disease

## 2022-02-08 ENCOUNTER — Encounter: Payer: Self-pay | Admitting: Pulmonary Disease

## 2022-02-08 VITALS — BP 136/74 | HR 92 | Temp 98.1°F | Ht 76.0 in | Wt 268.4 lb

## 2022-02-08 DIAGNOSIS — Z7722 Contact with and (suspected) exposure to environmental tobacco smoke (acute) (chronic): Secondary | ICD-10-CM

## 2022-02-08 DIAGNOSIS — R911 Solitary pulmonary nodule: Secondary | ICD-10-CM

## 2022-02-08 NOTE — Patient Instructions (Signed)
Thank you for visiting Dr. Tonia Brooms at [facility]. ?Today we recommend the following: ? ?Orders Placed This Encounter  ?Procedures  ? CT Chest Wo Contrast  ? ?Return in about 1 year (around 02/09/2023) for with Kandice Robinsons, NP, or Dr. Tonia Brooms. ? ? ? ?Please do your part to reduce the spread of COVID-19.  ? ?

## 2022-02-08 NOTE — Progress Notes (Signed)
Synopsis: Referred in March 2023 for lung nodule by No ref. provider found  Subjective:   PATIENT ID: Austin Heath GENDER: male DOB: [birth year], MRN: [id number]  Chief Complaint  Patient presents with   Consult    This is a 36 year old gentleman, past medical history of kidney stones.  He was seen in the emergency department with back pain and had a CT scan of the chest completed which found an incidental right upper lobe lung nodule against the fissure line that was 7 mm in size.  Patient has secondhand smoke exposure from parents and grandparents growing up but he does not smoke and has no alcohol use.  He works as a Nutritional therapist.   Past Medical History:  Diagnosis Date   Kidney stones      No family history on file.   Past Surgical History:  Procedure Laterality Date   EXTRACORPOREAL SHOCK WAVE LITHOTRIPSY Left 11/04/2020   Procedure: EXTRACORPOREAL SHOCK WAVE LITHOTRIPSY (ESWL);  Surgeon: Rene Paci, MD;  Location: [facility];  Service: Urology;  Laterality: Left;   LACERATION REPAIR      Social History   Socioeconomic History   Marital status: Single    Spouse name: Not on file   Number of children: Not on file   Years of education: Not on file   Highest education level: Not on file  Occupational History   Not on file  Tobacco Use   Smoking status: Never   Smokeless tobacco: Never  Vaping Use   Vaping Use: Never used  Substance and Sexual Activity   Alcohol use: Yes    Comment: occ   Drug use: No   Sexual activity: Not on file  Other Topics Concern   Not on file  Social History Narrative   Not on file   Social Determinants of Health   Financial Resource Strain: Not on file  Food Insecurity: Not on file  Transportation Needs: Not on file  Physical Activity: Not on file  Stress: Not on file  Social Connections: Not on file  Intimate Partner Violence: Not on file     Allergies  Allergen Reactions    Hydrocodone Nausea And Vomiting   Penicillins      Outpatient Medications Prior to Visit  Medication Sig Dispense Refill   azithromycin (ZITHROMAX) 250 MG tablet Take 1 tablet (250 mg total) by mouth daily. Take first 2 tablets together, then 1 every day until finished. 6 tablet 0   ibuprofen (ADVIL) 600 MG tablet Take 1 tablet (600 mg total) by mouth every 6 (six) hours as needed. 30 tablet 0   methocarbamol (ROBAXIN) 500 MG tablet Take 1 tablet (500 mg total) by mouth 2 (two) times daily as needed for muscle spasms. 20 tablet 0   ondansetron (ZOFRAN ODT) 4 MG disintegrating tablet Take 1 tablet (4 mg total) by mouth every 8 (eight) hours as needed for nausea or vomiting. 20 tablet 0   ondansetron (ZOFRAN) 4 MG tablet Take 1 tablet (4 mg total) by mouth every 6 (six) hours. 12 tablet 0   oxyCODONE (ROXICODONE) 5 MG immediate release tablet Take 1 tablet (5 mg total) by mouth every 6 (six) hours as needed for up to 20 doses for breakthrough pain. 20 tablet 0   oxyCODONE-acetaminophen (PERCOCET/ROXICET) 5-325 MG tablet Take 1 tablet by mouth every 4 (four) hours as needed for severe pain. 15 tablet 0   No facility-administered medications prior to visit.    Review of Systems  Constitutional:  Negative for chills, fever, malaise/fatigue and weight loss.  HENT:  Negative for hearing loss, sore throat and tinnitus.   Eyes:  Negative for blurred vision and double vision.  Respiratory:  Negative for cough, hemoptysis, sputum production, shortness of breath, wheezing and stridor.   Cardiovascular:  Negative for chest pain, palpitations, orthopnea, leg swelling and PND.  Gastrointestinal:  Negative for abdominal pain, constipation, diarrhea, heartburn, nausea and vomiting.  Genitourinary:  Negative for dysuria, hematuria and urgency.  Musculoskeletal:  Negative for joint pain and myalgias.  Skin:  Negative for itching and rash.  Neurological:  Negative for dizziness, tingling, weakness and  headaches.  Endo/Heme/Allergies:  Negative for environmental allergies. Does not bruise/bleed easily.  Psychiatric/Behavioral:  Negative for depression. The patient is not nervous/anxious and does not have insomnia.   All other systems reviewed and are negative.   Objective:  Physical Exam Vitals reviewed.  Constitutional:      General: He is not in acute distress.    Appearance: He is well-developed.  HENT:     Head: Normocephalic and atraumatic.  Eyes:     General: No scleral icterus.    Conjunctiva/sclera: Conjunctivae normal.     Pupils: Pupils are equal, round, and reactive to light.  Neck:     Vascular: No JVD.     Trachea: No tracheal deviation.  Cardiovascular:     Rate and Rhythm: Normal rate and regular rhythm.     Heart sounds: Normal heart sounds. No murmur heard. Pulmonary:     Effort: Pulmonary effort is normal. No tachypnea, accessory muscle usage or respiratory distress.     Breath sounds: Normal breath sounds. No stridor. No wheezing, rhonchi or rales.  Abdominal:     General: Bowel sounds are normal. There is no distension.     Palpations: Abdomen is soft.     Tenderness: There is no abdominal tenderness.  Musculoskeletal:        General: No tenderness.     Cervical back: Neck supple.  Lymphadenopathy:     Cervical: No cervical adenopathy.  Skin:    General: Skin is warm and dry.     Capillary Refill: Capillary refill takes less than 2 seconds.     Findings: No rash.  Neurological:     Mental Status: He is alert and oriented to person, place, and time.  Psychiatric:        Behavior: Behavior normal.     Vitals:   02/08/22 1504  BP: 136/74  Pulse: 92  Temp: 98.1 F (36.7 C)  TempSrc: Oral  SpO2: 98%  Weight: 268 lb 6.4 oz (121.7 kg)  Height: 6\' 4"  (1.93 m)   98% on RA BMI Readings from Last 3 Encounters:  02/08/22 32.67 kg/m  01/31/22 32.38 kg/m  01/24/21 31.89 kg/m   Wt Readings from Last 3 Encounters:  02/08/22 268 lb 6.4 oz  (121.7 kg)  01/31/22 266 lb (120.7 kg)  01/24/21 262 lb (118.8 kg)     CBC    Component Value Date/Time   WBC 7.8 01/31/2022 1639   RBC 6.02 (H) 01/31/2022 1639   HGB 17.4 (H) 01/31/2022 1639   HCT 50.7 01/31/2022 1639   PLT 182 01/31/2022 1639   MCV 84.2 01/31/2022 1639   MCH 28.9 01/31/2022 1639   MCHC 34.3 01/31/2022 1639   RDW 14.1 01/31/2022 1639   LYMPHSABS 2.6 01/31/2022 1639   MONOABS 0.8 01/31/2022 1639   EOSABS 0.2 01/31/2022 1639   BASOSABS 0.1 01/31/2022 1639  Chest Imaging: 01/31/2022 CT chest: 7 mm right lower lobe pulmonary nodule  Pulmonary Functions Testing Results: No flowsheet data found.  FeNO:   Pathology:   Echocardiogram:   Heart Catheterization:     Assessment & Plan:     ICD-10-CM   1. Lung nodule  R91.1 CT Chest Wo Contrast    2. Nodule of lower lobe of right lung  R91.1     3. Secondhand smoke exposure  Z77.22       Discussion:  This is a 36 year old gentleman, new incidental found right lower lobe pulmonary nodule secondhand smoke exposure.  Works as a Nutritional therapist lifelong non-smoker.  Plan: Repeat noncontrast CT scan of the chest in 12 months. Orders have been placed. Discussed risk of malignancy today in the office with patient.  I think this is low less than 5% chance with a small nodule subcentimeter in size and lifelong non-smoker. We will document stability with a repeat scan in 1 year.  Follow-up with [place] in 1 year after CT complete.     Current Outpatient Medications:    azithromycin (ZITHROMAX) 250 MG tablet, Take 1 tablet (250 mg total) by mouth daily. Take first 2 tablets together, then 1 every day until finished., Disp: 6 tablet, Rfl: 0   ibuprofen (ADVIL) 600 MG tablet, Take 1 tablet (600 mg total) by mouth every 6 (six) hours as needed., Disp: 30 tablet, Rfl: 0   methocarbamol (ROBAXIN) 500 MG tablet, Take 1 tablet (500 mg total) by mouth 2 (two) times daily as needed for muscle spasms., Disp: 20  tablet, Rfl: 0   ondansetron (ZOFRAN ODT) 4 MG disintegrating tablet, Take 1 tablet (4 mg total) by mouth every 8 (eight) hours as needed for nausea or vomiting., Disp: 20 tablet, Rfl: 0   ondansetron (ZOFRAN) 4 MG tablet, Take 1 tablet (4 mg total) by mouth every 6 (six) hours., Disp: 12 tablet, Rfl: 0   oxyCODONE (ROXICODONE) 5 MG immediate release tablet, Take 1 tablet (5 mg total) by mouth every 6 (six) hours as needed for up to 20 doses for breakthrough pain., Disp: 20 tablet, Rfl: 0   oxyCODONE-acetaminophen (PERCOCET/ROXICET) 5-325 MG tablet, Take 1 tablet by mouth every 4 (four) hours as needed for severe pain., Disp: 15 tablet, Rfl: 0    Josephine Igo, DO  Pulmonary Critical Care 02/08/2022 3:34 PM

## 2022-10-25 ENCOUNTER — Other Ambulatory Visit: Payer: Self-pay

## 2022-10-25 ENCOUNTER — Other Ambulatory Visit (HOSPITAL_BASED_OUTPATIENT_CLINIC_OR_DEPARTMENT_OTHER): Payer: Self-pay

## 2022-10-25 ENCOUNTER — Emergency Department (HOSPITAL_BASED_OUTPATIENT_CLINIC_OR_DEPARTMENT_OTHER)
Admission: EM | Admit: 2022-10-25 | Discharge: 2022-10-25 | Disposition: A | Discharge status: Home / Self Care | Payer: BLUE CROSS/BLUE SHIELD | Attending: Emergency Medicine | Admitting: Emergency Medicine

## 2022-10-25 DIAGNOSIS — M436 Torticollis: Secondary | ICD-10-CM | POA: Insufficient documentation

## 2022-10-25 DIAGNOSIS — M542 Cervicalgia: Secondary | ICD-10-CM | POA: Diagnosis present

## 2022-10-25 MED ORDER — KETOROLAC TROMETHAMINE 15 MG/ML IJ SOLN
15.0000 mg | Freq: Once | INTRAMUSCULAR | Status: AC
  Administered 2022-10-25: 15 mg via INTRAMUSCULAR
  Filled 2022-10-25: qty 1

## 2022-10-25 MED ORDER — DIAZEPAM 5 MG PO TABS
5.0000 mg | ORAL_TABLET | Freq: Once | ORAL | Status: AC
Start: 2022-10-25 — End: 2022-10-25
  Administered 2022-10-25: 5 mg via ORAL
  Filled 2022-10-25: qty 1

## 2022-10-25 MED ORDER — ACETAMINOPHEN 500 MG PO TABS
1000.0000 mg | ORAL_TABLET | Freq: Once | ORAL | Status: AC
  Administered 2022-10-25: 1000 mg via ORAL
  Filled 2022-10-25: qty 2

## 2022-10-25 MED ORDER — METHOCARBAMOL 500 MG PO TABS
500.0000 mg | ORAL_TABLET | Freq: Two times a day (BID) | ORAL | 0 refills | Status: DC | PRN
  Filled 2022-10-25: qty 20, 10d supply, fill #0

## 2022-10-25 MED ORDER — OXYCODONE HCL 5 MG PO TABS
5.0000 mg | ORAL_TABLET | Freq: Once | ORAL | Status: AC
  Administered 2022-10-25: 5 mg via ORAL
  Filled 2022-10-25: qty 1

## 2022-10-25 NOTE — ED Triage Notes (Signed)
States woke up last Tuesday with neck pain. States unable to turn head left or right without pain. Denies injury. Tried heating pads & aleve without relief

## 2022-10-25 NOTE — Discharge Instructions (Addendum)
Take 4 over the counter ibuprofen tablets 3 times a day or 2 over-the-counter naproxen tablets twice a day for pain. Also take tylenol 1000mg (2 extra strength) four times a day.   Please follow-up with the doctor in the office.  I have provided you information to call the sports medicine doctor upstairs and he can see you and try and evaluate you.  Please try to establish with a primary care provider.  Your blood pressure was a bit high today this needs to be rechecked in a doctor's office when you are not having significant discomfort.

## 2022-10-25 NOTE — ED Provider Notes (Signed)
[facility] EMERGENCY DEPARTMENT Provider Note   CSN: [id number] Arrival date & time: 10/25/22  1307     History  Chief Complaint  Patient presents with   Neck Pain    Austin Heath is a 36 y.o. male.  36 yo M with a chief complaint of neck pain.  This been going on for almost a week now.  He woke up 1 morning and felt like it was sore.  Especially when trying to turn from side-to-side.  Also worse when he tries to extend.  He denies trauma to the area.  He tried to change his pillow and is bed without significant change.  Denies numbness or weakness to the arms.   Neck Pain      Home Medications Prior to Admission medications   Medication Sig Start Date End Date Taking? Authorizing Provider  azithromycin (ZITHROMAX) 250 MG tablet Take 1 tablet (250 mg total) by mouth daily. Take first 2 tablets together, then 1 every day until finished. 01/31/22   Achille Rich, PA-C  ibuprofen (ADVIL) 600 MG tablet Take 1 tablet (600 mg total) by mouth every 6 (six) hours as needed. 01/31/22   Achille Rich, PA-C  methocarbamol (ROBAXIN) 500 MG tablet Take 1 tablet (500 mg total) by mouth 2 (two) times daily as needed for muscle spasms. 10/25/22   Melene Plan, DO  ondansetron (ZOFRAN ODT) 4 MG disintegrating tablet Take 1 tablet (4 mg total) by mouth every 8 (eight) hours as needed for nausea or vomiting. 10/28/20   Venter, Margaux, PA-C  ondansetron (ZOFRAN) 4 MG tablet Take 1 tablet (4 mg total) by mouth every 6 (six) hours. 01/24/21   Curatolo, Adam, DO  oxyCODONE (ROXICODONE) 5 MG immediate release tablet Take 1 tablet (5 mg total) by mouth every 6 (six) hours as needed for up to 20 doses for breakthrough pain. 01/24/21   Curatolo, Adam, DO  oxyCODONE-acetaminophen (PERCOCET/ROXICET) 5-325 MG tablet Take 1 tablet by mouth every 4 (four) hours as needed for severe pain. 11/02/20   Sponseller, Eugene Gavia, PA-C      Allergies    Hydrocodone and Penicillins    Review of Systems    Review of Systems  Musculoskeletal:  Positive for neck pain.    Physical Exam Updated Vital Signs BP (!) 167/102 (BP Location: Right Arm)   Pulse 95   Temp 97.7 F (36.5 C) (Oral)   Resp 18   Ht 6\' 4"  (1.93 m)   Wt 117.9 kg   SpO2 97%   BMI 31.65 kg/m  Physical Exam Vitals and nursing note reviewed.  Constitutional:      Appearance: He is well-developed.  HENT:     Head: Normocephalic and atraumatic.  Eyes:     Pupils: Pupils are equal, round, and reactive to light.  Neck:     Vascular: No JVD.  Cardiovascular:     Rate and Rhythm: Normal rate and regular rhythm.     Heart sounds: No murmur heard.    No friction rub. No gallop.  Pulmonary:     Effort: No respiratory distress.     Breath sounds: No wheezing.  Abdominal:     General: There is no distension.     Tenderness: There is no abdominal tenderness. There is no guarding or rebound.  Musculoskeletal:        General: Normal range of motion.     Cervical back: Normal range of motion and neck supple.     Comments: Decreased sensation over  the radial aspect of the right hand, chronic per patient.  Otherwise pulse motor and sensation intact in bilateral lower extremities.  No appreciable weakness.  Skin:    Coloration: Skin is not pale.     Findings: No rash.  Neurological:     Mental Status: He is alert and oriented to person, place, and time.  Psychiatric:        Behavior: Behavior normal.     ED Results / Procedures / Treatments   Labs (all labs ordered are listed, but only abnormal results are displayed) Labs Reviewed - No data to display  EKG None  Radiology No results found.  Procedures Procedures    Medications Ordered in ED Medications  acetaminophen (TYLENOL) tablet 1,000 mg (has no administration in time range)  ketorolac (TORADOL) 15 MG/ML injection 15 mg (has no administration in time range)  oxyCODONE (Oxy IR/ROXICODONE) immediate release tablet 5 mg (has no administration in time  range)  diazepam (VALIUM) tablet 5 mg (has no administration in time range)    ED Course/ Medical Decision Making/ A&P                           Medical Decision Making Risk OTC drugs. Prescription drug management.   36 yo M with a chief complaints of neck pain.  This been going on for almost a week now.  Atraumatic.  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  Doubt meningitis.  Doubt carotid or vertebral artery dissection.  Neurovascularly intact distally.  Will treat supportively.  Given sports medicine follow-up.  1:22 PM:  I have discussed the diagnosis/risks/treatment options with the patient.  Evaluation and diagnostic testing in the emergency department does not suggest an emergent condition requiring admission or immediate intervention beyond what has been performed at this time.  They will follow up with PCP. We also discussed returning to the ED immediately if new or worsening sx occur. We discussed the sx which are most concerning (e.g., sudden worsening pain, fever, inability to tolerate by mouth) that necessitate immediate return. Medications administered to the patient during their visit and any new prescriptions provided to the patient are listed below.  Medications given during this visit Medications  acetaminophen (TYLENOL) tablet 1,000 mg (has no administration in time range)  ketorolac (TORADOL) 15 MG/ML injection 15 mg (has no administration in time range)  oxyCODONE (Oxy IR/ROXICODONE) immediate release tablet 5 mg (has no administration in time range)  diazepam (VALIUM) tablet 5 mg (has no administration in time range)     The patient appears reasonably screen and/or stabilized for discharge and I doubt any other medical condition or other [facility] requiring further screening, evaluation, or treatment in the ED at this time prior to discharge.          Final Clinical Impression(s) / ED Diagnoses Final diagnoses:  Torticollis    Rx / DC Orders ED Discharge  Orders          Ordered    methocarbamol (ROBAXIN) 500 MG tablet  2 times daily PRN        10/25/22 1320              Melene Plan, DO 10/25/22 1322

## 2023-02-05 ENCOUNTER — Ambulatory Visit (HOSPITAL_BASED_OUTPATIENT_CLINIC_OR_DEPARTMENT_OTHER): Payer: BLUE CROSS/BLUE SHIELD

## 2023-02-14 ENCOUNTER — Ambulatory Visit (INDEPENDENT_AMBULATORY_CARE_PROVIDER_SITE_OTHER): Payer: BLUE CROSS/BLUE SHIELD | Admitting: Pulmonary Disease

## 2023-02-14 ENCOUNTER — Encounter: Payer: Self-pay | Admitting: Pulmonary Disease

## 2023-02-14 VITALS — BP 120/80 | HR 70 | Ht 76.0 in | Wt 262.6 lb

## 2023-02-14 DIAGNOSIS — R911 Solitary pulmonary nodule: Secondary | ICD-10-CM | POA: Diagnosis not present

## 2023-02-14 NOTE — Progress Notes (Signed)
Synopsis: Referred in March 2023 for lung nodule by No ref. provider found  Subjective:   PATIENT ID: Austin Heath GENDER: male DOB: 1986/01/11, MRN: [id number]  Chief Complaint  Patient presents with   Follow-up    F/up CT    This is a 37 year old gentleman, past medical history of kidney stones.  He was seen in the emergency department with back pain and had a CT scan of the chest completed which found an incidental right upper lobe lung nodule against the fissure line that was 7 mm in size.  Patient has secondhand smoke exposure from parents and grandparents growing up but he does not smoke and has no alcohol use.  He works as a Counsellor.  OV 02/14/2023: Follow-up CT imaging.  CT imaging follow-up was completed at [facility].  No significant nodules found.  Patient to follow-up with [place] as needed.  No respiratory complaints.  Reviewed CT imaging.    Past Medical History:  Diagnosis Date   Kidney stones      No family history on file.   Past Surgical History:  Procedure Laterality Date   EXTRACORPOREAL SHOCK WAVE LITHOTRIPSY Left 11/04/2020   Procedure: EXTRACORPOREAL SHOCK WAVE LITHOTRIPSY (ESWL);  Surgeon: Ceasar Mons, MD;  Location: [facility];  Service: Urology;  Laterality: Left;   LACERATION REPAIR      Social History   Socioeconomic History   Marital status: Single    Spouse name: Not on file   Number of children: Not on file   Years of education: Not on file   Highest education level: Not on file  Occupational History   Not on file  Tobacco Use   Smoking status: Never   Smokeless tobacco: Never  Vaping Use   Vaping Use: Never used  Substance and Sexual Activity   Alcohol use: Yes    Comment: occ   Drug use: No   Sexual activity: Not on file  Other Topics Concern   Not on file  Social History Narrative   Not on file   Social Determinants of Health   Financial Resource Strain: Not on file  Food Insecurity: Not  on file  Transportation Needs: Not on file  Physical Activity: Not on file  Stress: Not on file  Social Connections: Not on file  Intimate Partner Violence: Not on file     Allergies  Allergen Reactions   Hydrocodone Nausea And Vomiting   Penicillins      Outpatient Medications Prior to Visit  Medication Sig Dispense Refill   azithromycin (ZITHROMAX) 250 MG tablet Take 1 tablet (250 mg total) by mouth daily. Take first 2 tablets together, then 1 every day until finished. 6 tablet 0   ibuprofen (ADVIL) 600 MG tablet Take 1 tablet (600 mg total) by mouth every 6 (six) hours as needed. 30 tablet 0   methocarbamol (ROBAXIN) 500 MG tablet Take 1 tablet (500 mg total) by mouth 2 (two) times daily as needed for muscle spasms. 20 tablet 0   ondansetron (ZOFRAN ODT) 4 MG disintegrating tablet Take 1 tablet (4 mg total) by mouth every 8 (eight) hours as needed for nausea or vomiting. 20 tablet 0   ondansetron (ZOFRAN) 4 MG tablet Take 1 tablet (4 mg total) by mouth every 6 (six) hours. 12 tablet 0   oxyCODONE (ROXICODONE) 5 MG immediate release tablet Take 1 tablet (5 mg total) by mouth every 6 (six) hours as needed for up to 20 doses for breakthrough pain.  20 tablet 0   oxyCODONE-acetaminophen (PERCOCET/ROXICET) 5-325 MG tablet Take 1 tablet by mouth every 4 (four) hours as needed for severe pain. 15 tablet 0   No facility-administered medications prior to visit.    Review of Systems  Constitutional:  Negative for chills, fever, malaise/fatigue and weight loss.  HENT:  Negative for hearing loss, sore throat and tinnitus.   Eyes:  Negative for blurred vision and double vision.  Respiratory:  Negative for cough, hemoptysis, sputum production, shortness of breath, wheezing and stridor.   Cardiovascular:  Negative for chest pain, palpitations, orthopnea, leg swelling and PND.  Gastrointestinal:  Negative for abdominal pain, constipation, diarrhea, heartburn, nausea and vomiting.  Genitourinary:   Negative for dysuria, hematuria and urgency.  Musculoskeletal:  Negative for joint pain and myalgias.  Skin:  Negative for itching and rash.  Neurological:  Negative for dizziness, tingling, weakness and headaches.  Endo/Heme/Allergies:  Negative for environmental allergies. Does not bruise/bleed easily.  Psychiatric/Behavioral:  Negative for depression. The patient is not nervous/anxious and does not have insomnia.   All other systems reviewed and are negative.    Objective:  Physical Exam Vitals reviewed.  Constitutional:      General: He is not in acute distress.    Appearance: He is well-developed.  HENT:     Head: Normocephalic and atraumatic.  Eyes:     General: No scleral icterus.    Conjunctiva/sclera: Conjunctivae normal.     Pupils: Pupils are equal, round, and reactive to light.  Neck:     Vascular: No JVD.     Trachea: No tracheal deviation.  Cardiovascular:     Rate and Rhythm: Normal rate and regular rhythm.     Heart sounds: Normal heart sounds. No murmur heard. Pulmonary:     Effort: Pulmonary effort is normal. No tachypnea, accessory muscle usage or respiratory distress.     Breath sounds: No stridor. No wheezing, rhonchi or rales.  Abdominal:     General: There is no distension.     Palpations: Abdomen is soft.     Tenderness: There is no abdominal tenderness.  Musculoskeletal:        General: No tenderness.     Cervical back: Neck supple.  Lymphadenopathy:     Cervical: No cervical adenopathy.  Skin:    General: Skin is warm and dry.     Capillary Refill: Capillary refill takes less than 2 seconds.     Findings: No rash.  Neurological:     Mental Status: He is alert and oriented to person, place, and time.  Psychiatric:        Behavior: Behavior normal.      Vitals:   02/14/23 1113  BP: 120/80  Pulse: 70  SpO2: 97%  Weight: 262 lb 9.6 oz (119.1 kg)  Height: '6\' 4"'$  (1.93 m)   97% on RA BMI Readings from Last 3 Encounters:  02/14/23 31.96  kg/m  10/25/22 31.65 kg/m  02/08/22 32.67 kg/m   Wt Readings from Last 3 Encounters:  02/14/23 262 lb 9.6 oz (119.1 kg)  10/25/22 260 lb (117.9 kg)  02/08/22 268 lb 6.4 oz (121.7 kg)     CBC    Component Value Date/Time   WBC 7.8 01/31/2022 1639   RBC 6.02 (H) 01/31/2022 1639   HGB 17.4 (H) 01/31/2022 1639   HCT 50.7 01/31/2022 1639   PLT 182 01/31/2022 1639   MCV 84.2 01/31/2022 1639   MCH 28.9 01/31/2022 1639   MCHC 34.3 01/31/2022 1639  RDW 14.1 01/31/2022 1639   LYMPHSABS 2.6 01/31/2022 1639   MONOABS 0.8 01/31/2022 1639   EOSABS 0.2 01/31/2022 1639   BASOSABS 0.1 01/31/2022 1639    Chest Imaging: 01/31/2022 CT chest: 7 mm right lower lobe pulmonary nodule  Pulmonary Functions Testing Results:     No data to display          FeNO:   Pathology:   Echocardiogram:   Heart Catheterization:     Assessment & Plan:     ICD-10-CM   1. 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  Otherwise lifelong non-smoker.  Follow-up CT imaging reveals resolution of pulmonary nodule.  Plan: No additional CT follow-up needed at this time. He can return to clinic to see [place] as needed.   No current outpatient medications on file.    [facility] 02/14/2023 11:50 AM

## 2023-02-14 NOTE — Patient Instructions (Signed)
Thank you for visiting Dr. Elandra Powell at [facility]. Today we recommend the following:  Return if symptoms worsen or fail to improve.    Please do your part to reduce the spread of COVID-19.  

## 2023-09-14 IMAGING — CT CT ANGIO CHEST
2 of 9 series · 18 of 36 positions shown · IV contrast (agent unspecified)
Comparison: None.

CLINICAL DATA: Right-sided chest pain for 3-4 weeks.

EXAM:
CT ANGIOGRAPHY CHEST WITH CONTRAST
TECHNIQUE: Multidetector CT imaging of the chest was performed using the
standard protocol during bolus administration of intravenous
contrast. Multiplanar CT image reconstructions and MIPs were
obtained to evaluate the vascular anatomy.

[Series 6: pe thins · axial · 0.96mm/px · z∈[-375,-99]mm · 17 of 417 slices shown]
[im 24/417  lung]
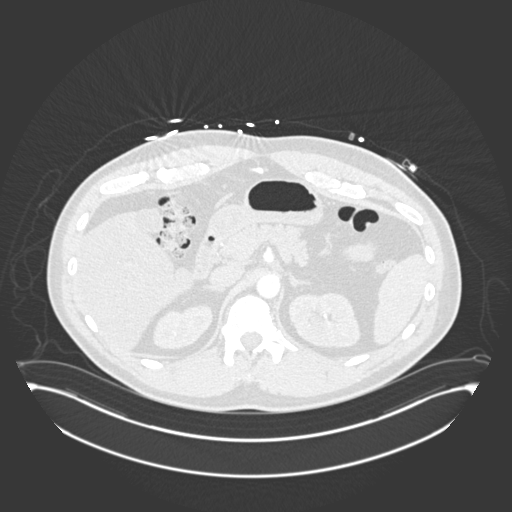
[im 47/417  mediastinal]
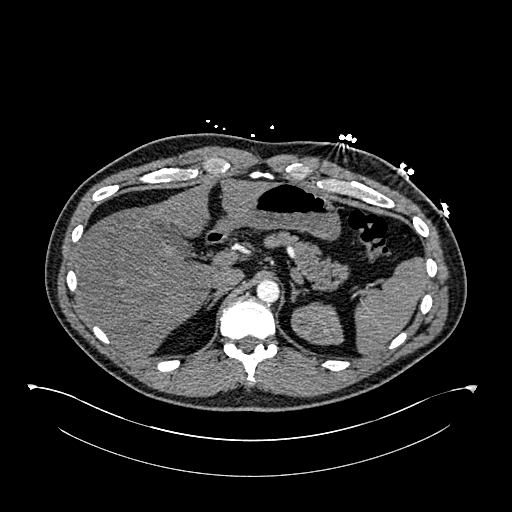
[im 70/417  lung]
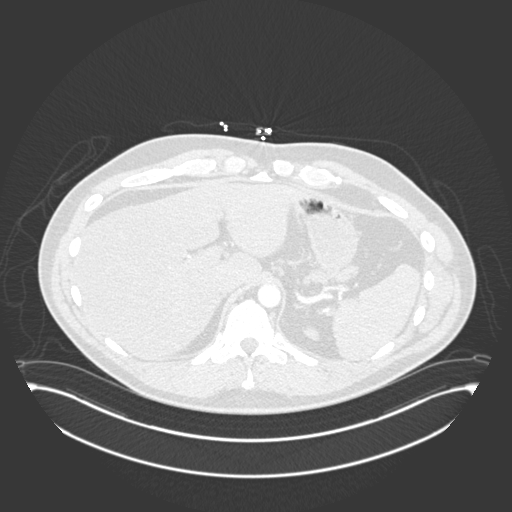
[im 93/417  mediastinal]
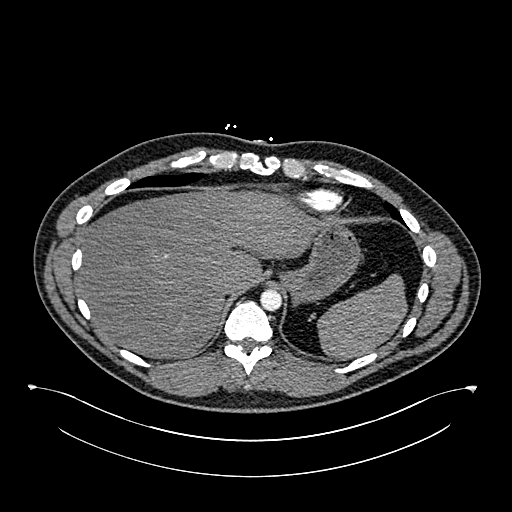
[im 116/417  lung]
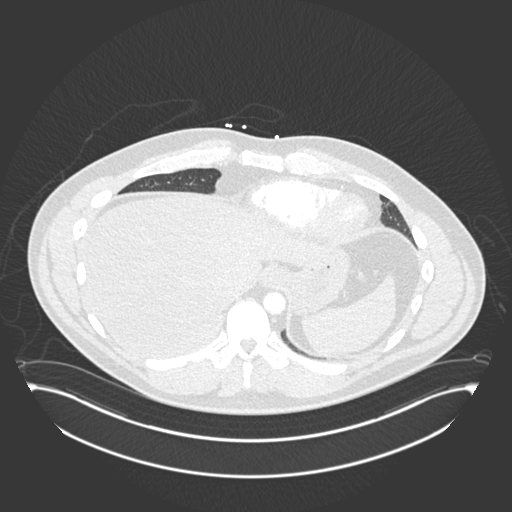
[im 139/417  mediastinal]
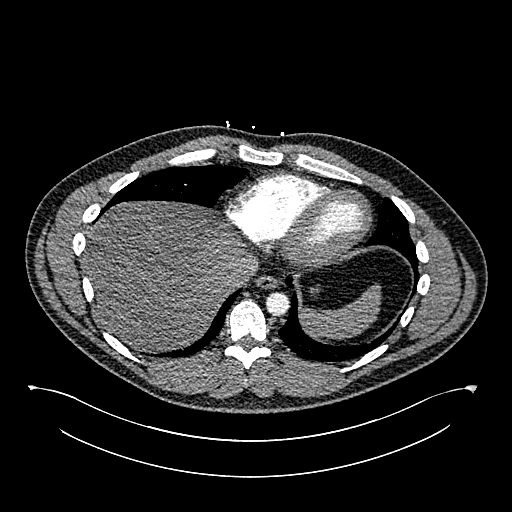
[im 162/417  lung]
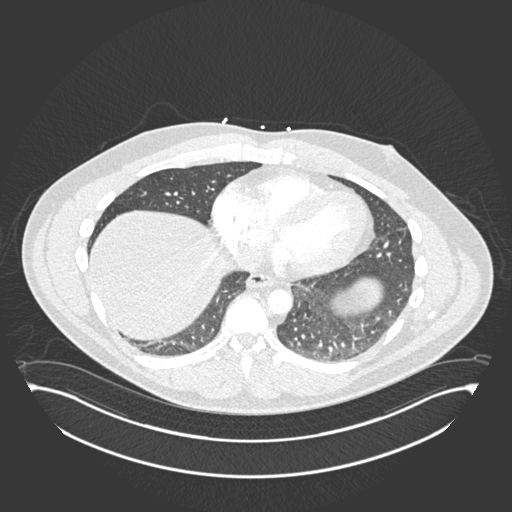
[im 185/417  mediastinal]
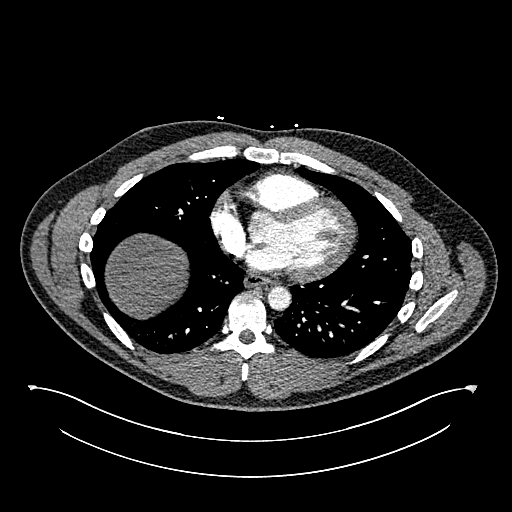
[im 209/417  lung]
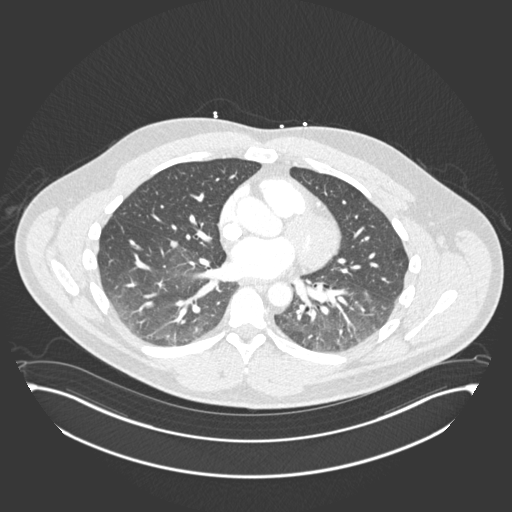
[im 232/417  mediastinal]
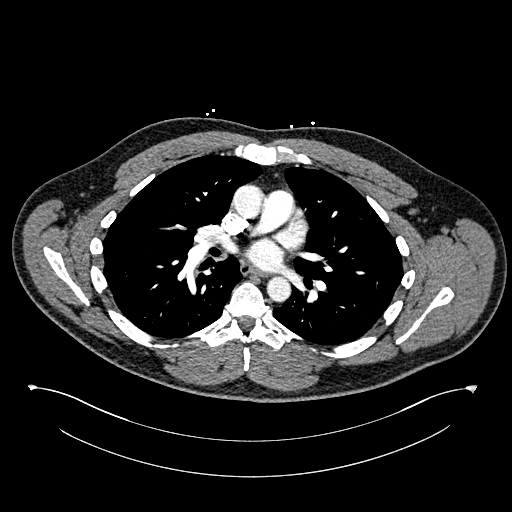
[im 255/417  lung]
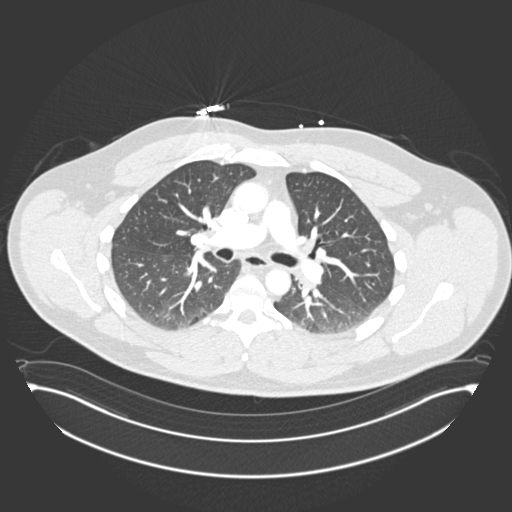
[im 278/417  mediastinal]
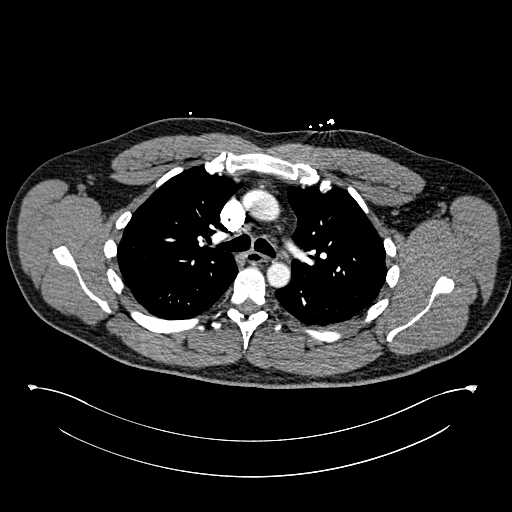
[im 301/417  lung]
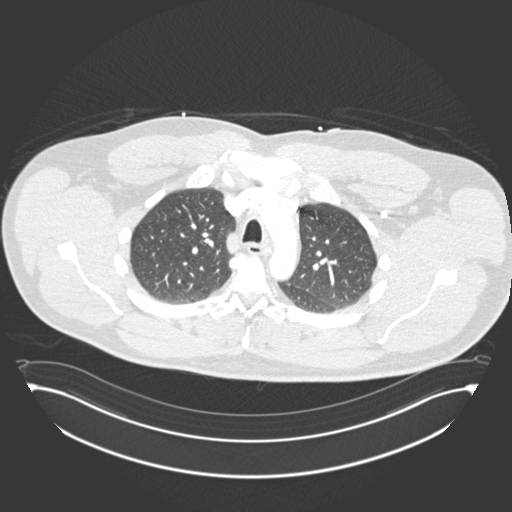
[im 324/417  mediastinal]
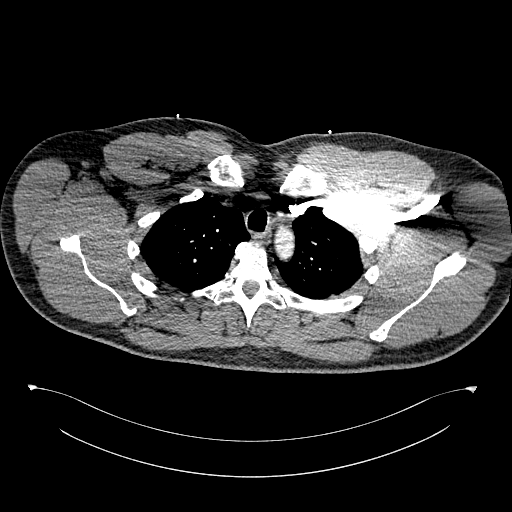
[im 347/417  lung]
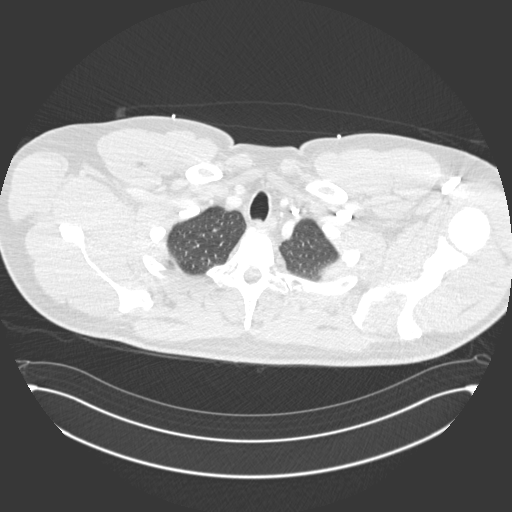
[im 370/417  mediastinal]
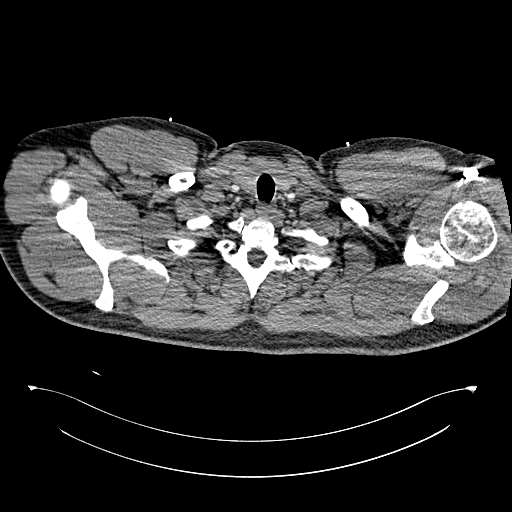
[im 393/417  lung]
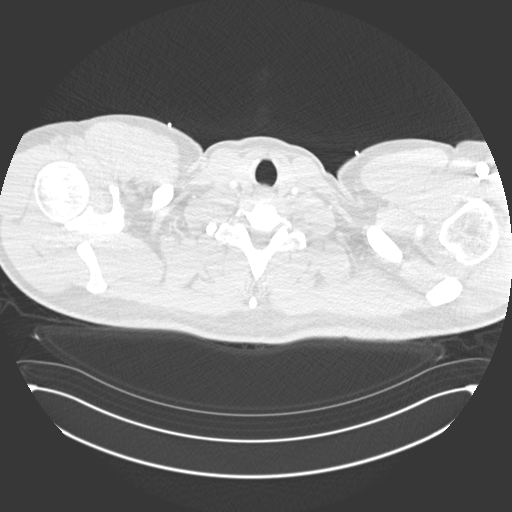

[Series 7: pe coronal mpr · coronal · 0.62mm/px · 1 of 101 slices shown]
[im 51/101  mediastinal]
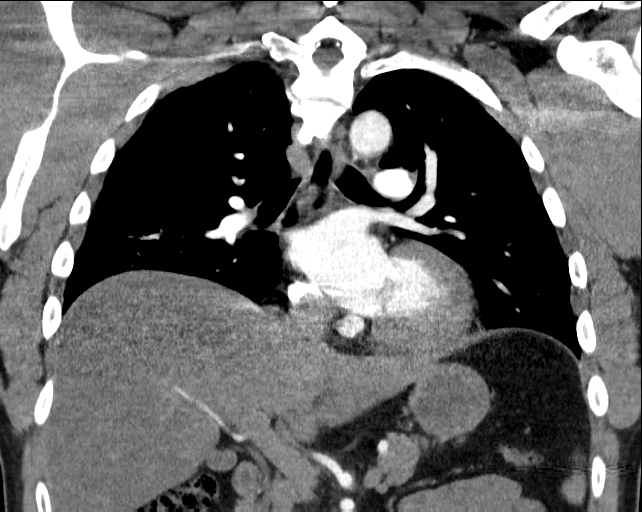

[18 of 36 positions shown; findings below may reference images not displayed]

RADIATION DOSE REDUCTION: This exam was performed according to the
departmental dose-optimization program which includes automated
exposure control, adjustment of the mA and/or kV according to
patient size and/or use of iterative reconstruction technique.

CONTRAST:  100mL OMNIPAQUE IOHEXOL 350 MG/ML SOLN
FINDINGS: Cardiovascular: The heart is normal in size. No pericardial
effusion. The aorta is normal in caliber. No dissection or
atherosclerotic calcification. The branch vessels are patent. No
coronary artery calcifications.

The pulmonary arterial tree is fairly well opacified. No filling
defects to suggest pulmonary embolism.

Mediastinum/Nodes: Borderline enlarged mediastinal and hilar nodes
likely reactive/inflammatory. The esophagus is grossly.

Lungs/Pleura: Patchy ground-glass opacity most notably in the lower
lobes bilaterally suggesting an inflammatory or atypical infectious
process. No focal airspace consolidation. No pleural effusions. 7 mm
pulmonary nodule noted in the right lower lobe adjacent to the major
recommended. If the nodule is stable at time of repeat CT, then
future CT at 18-24 months (from today's scan) is considered optional
for low-risk patients, but is recommended for high-risk patients.
This recommendation follows the consensus statement: Guidelines for
Management of Incidental Pulmonary Nodules Detected on CT Images:

Upper Abdomen: No significant upper abdominal findings.

Musculoskeletal: No significant bony findings.

Review of the MIP images confirms the above findings.
IMPRESSION: 1. No CT findings for pulmonary embolism.
2. Normal thoracic aorta.
3. Patchy ground-glass opacity most notably in the lower lobes
bilaterally suggesting an inflammatory or atypical infectious
process.
4. 7 mm pulmonary nodule in the right lower lobe. Non-contrast chest
CT at 6-12 months is recommended.
